# Patient Record
Sex: Female | Born: 2000 | Hispanic: No | Marital: Single | Smoking: Never smoker
Health system: Southern US, Community
[De-identification: ages and names within clinical notes are randomized; demographics above are authoritative.]

## PROBLEM LIST (undated history)

## (undated) DIAGNOSIS — G43909 Migraine, unspecified, not intractable, without status migrainosus: Secondary | ICD-10-CM

## (undated) DIAGNOSIS — R42 Dizziness and giddiness: Secondary | ICD-10-CM

## (undated) HISTORY — PX: NO PAST SURGERIES: SHX2092

---
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
## 2019-05-04 ENCOUNTER — Other Ambulatory Visit: Payer: Self-pay

## 2019-05-04 DIAGNOSIS — Z20822 Contact with and (suspected) exposure to covid-19: Secondary | ICD-10-CM

## 2019-05-06 LAB — NOVEL CORONAVIRUS, NAA: SARS-CoV-2, NAA: NOT DETECTED

## 2019-08-02 ENCOUNTER — Other Ambulatory Visit: Payer: Self-pay

## 2019-08-02 DIAGNOSIS — Z20822 Contact with and (suspected) exposure to covid-19: Secondary | ICD-10-CM

## 2019-08-04 LAB — NOVEL CORONAVIRUS, NAA: SARS-CoV-2, NAA: NOT DETECTED

## 2019-11-22 ENCOUNTER — Encounter: Payer: Self-pay | Admitting: Physical Therapy

## 2019-11-22 ENCOUNTER — Ambulatory Visit: Payer: Medicaid Other | Attending: Orthopedic Surgery | Admitting: Physical Therapy

## 2019-11-22 ENCOUNTER — Other Ambulatory Visit: Payer: Self-pay

## 2019-11-22 DIAGNOSIS — M6281 Muscle weakness (generalized): Secondary | ICD-10-CM

## 2019-11-22 DIAGNOSIS — G8929 Other chronic pain: Secondary | ICD-10-CM | POA: Diagnosis present

## 2019-11-22 DIAGNOSIS — S83012D Lateral subluxation of left patella, subsequent encounter: Secondary | ICD-10-CM | POA: Insufficient documentation

## 2019-11-22 DIAGNOSIS — M25562 Pain in left knee: Secondary | ICD-10-CM | POA: Insufficient documentation

## 2019-11-22 NOTE — Patient Instructions (Signed)
Access Code: [email or web address]: [email or web address]: 04/01/2021Prepared by: Casimiro Needle SherkExercises  Straight Leg Raise with External Rotation - 1 x daily - 7 x weekly - 1 sets - 20 reps  Supine Quad Set - 1 x daily - 7 x weekly - 1 sets - 10 reps - 10 seconds hold  Bridge - 1 x daily - 7 x weekly - 1 sets - 20 reps  Standard Lunge - 1 x daily - 7 x weekly - 1 sets - 10 reps - 5 seconds hold  Side Stepping with Resistance at Thighs - 1 x daily - 7 x weekly - 1 sets - 20 reps

## 2019-11-22 NOTE — Therapy (Signed)
[facility] [address] [place], [place], [address] Phone: [phone]   Fax:  [phone]  Physical Therapy Evaluation  Patient Details  Name: Cassandra Moore MRN: [id number] Date of Birth: 08-05-2001 Referring Provider (PT): Reche Dixon, [place]   Encounter Date: 11/22/2019  PT End of Session - 11/22/19 1219    Visit Number  1    Number of Visits  5    Date for PT Re-Evaluation  12/20/19    PT Start Time  1027    PT Stop Time  1120    PT Time Calculation (min)  53 min    Activity Tolerance  Patient tolerated treatment well;Patient limited by pain    Behavior During Therapy  [facility] for tasks assessed/performed       Past Medical History:  Diagnosis Date  . Migraine   . Migraines   . Vertigo   . Vertigo     Past Surgical History:  Procedure Laterality Date  . NO PAST SURGERIES      There were no vitals filed for this visit.   Subjective Assessment - 11/22/19 1136    Subjective  Pt. reports chronic L knee pain.  Pt. has had PT in past with benefit.  Pt. c/o 5/10 L knee pain currently at rest.  Pt. has supportive knee brace that she will wear with increase activity.    Limitations  Standing;Walking;Other (comment);House hold activities    Patient Stated Goals  Increase L knee stability/ pain-free mobility.    Currently in Pain?  Yes    Pain Score  5     Pain Location  Knee    Pain Orientation  Left    Pain Descriptors / Indicators  Aching    Pain Type  Chronic pain         [facility] PT Assessment - 11/22/19 0001      Assessment   Medical Diagnosis  Lateral subluxation of left patella/ Chronic pain of left knee    Referring Provider (PT)  Reche Dixon, PA-C    Onset Date/Surgical Date  08/24/15    Hand Dominance  Right    Prior Therapy  Yes, with benefit      Prior Function   Level of Independence  Independent      Cognition   Overall Cognitive Status  Within Functional Limits for tasks assessed       See  flowsheet    Objective measurements completed on examination: See above findings.     See HEP   PT Education - 11/22/19 1219    Education Details  See HEP/ discussed knee brace and taping.    Person(s) Educated  Patient    Methods  Explanation;Demonstration;Handout    Comprehension  Verbalized understanding;Returned demonstration          PT Long Term Goals - 11/22/19 1234      PT LONG TERM GOAL #1   Title  Pt. will increase FOTO to 68 to improve pain-free mobility.    Baseline  Initial FOTO: 59    Time  4    Period  Weeks    Status  New    Target Date  12/20/19      PT LONG TERM GOAL #2   Title  Pt. able to complete 30 minute LE ex. program with no increase c/o L knee pain.    Baseline  L knee pain 5/10 and pt. hoping to return to gym based ex.    Time  4    Period  Weeks    Status  New    Target Date  12/20/19      PT LONG TERM GOAL #3   Title  Pt. able to complete all work-related tasks with proper body mechanics/ no increase c/o L knee pain.    Baseline  Pt. currently out of work at [employer] due to L knee pain.    Time  4    Period  Weeks    Status  New    Target Date  12/20/19      PT LONG TERM GOAL #4   Title  Pt. will report no episodes of L knee subluxation over past 4 weeks to improve knee stability.    Baseline  Pt. reports L lateral knee subluxation while at work/ pivoting on L LE.    Time  4    Period  Weeks    Status  New    Target Date  12/20/19             Plan - 11/22/19 1220    Clinical Impression Statement  Pt. is a pleasant 19 y/o female with chronic c/o L knee pain.  Pt. reports 5/10 L knee pain at rest, 0/10 at best.  Pt. works at [employer] and is currently off work due to L knee pain/limitations.  B knee AROM WNL (slight hyperextension).  B LE muscle strength 5/5 MMT except hip flexion/ abduction 4+/5 MMT.  Pt. has good patellar tracking with squats/lunges.  Significant L patella hypermobility (lateral > medial).  Slight anterior  translation of B proximal tibia.  (-) anterior drawer/ varus/ valgus tests.  No knee joint line swelling noted.  FOTO: initial 59/ goal 68.  Pt. ambulates with normalized gait pattern with slight B foot IV noted during stance phase of gait.  Pt. planning to return to [employer] for cardio/ strengthening ex. program.  Pt. will benefit from skilled PT services to increase hip/knee stability to improve pain-free mobility with home/ work-related tasks.    Stability/Clinical Decision Making  Stable/Uncomplicated    Clinical Decision Making  Low    Rehab Potential  Good    PT Frequency  1x / week    PT Duration  4 weeks    PT Treatment/Interventions  ADLs/Self Care Home Management;Moist Heat;Electrical Stimulation;Cryotherapy;Functional mobility training;Therapeutic activities;Stair training;Gait training;Therapeutic exercise;Balance training;Neuromuscular re-education;Patient/family education;Manual techniques;Dry needling    PT Next Visit Plan  Progress LE stability ex. program.  Discuss return to [employer].    PT Home Exercise Plan  [id number]       Patient will benefit from skilled therapeutic intervention in order to improve the following deficits and impairments:  Abnormal gait, Decreased mobility, Obesity, Decreased range of motion, Decreased activity tolerance, Decreased strength, Hypermobility, Pain  Visit Diagnosis: Chronic pain of left knee  Chronic patellofemoral pain of left knee  Lateral subluxation of left patella, subsequent encounter  Muscle weakness (generalized)     Problem List There are no problems to display for this patient.  Cammie Mcgee, PT, DPT # [phone] 11/22/2019, 12:39 PM  [facility] [address] [place], [place], [address] Phone: [phone]   Fax:  [phone]  Name: Marcille Barman MRN: [id number] Date of Birth: 10-07-2000

## 2019-11-29 ENCOUNTER — Ambulatory Visit: Payer: Medicaid Other | Admitting: Physical Therapy

## 2019-11-29 ENCOUNTER — Other Ambulatory Visit: Payer: Self-pay

## 2019-11-29 ENCOUNTER — Encounter: Payer: Self-pay | Admitting: Physical Therapy

## 2019-11-29 DIAGNOSIS — M6281 Muscle weakness (generalized): Secondary | ICD-10-CM

## 2019-11-29 DIAGNOSIS — S83012D Lateral subluxation of left patella, subsequent encounter: Secondary | ICD-10-CM

## 2019-11-29 DIAGNOSIS — M25562 Pain in left knee: Secondary | ICD-10-CM

## 2019-11-29 DIAGNOSIS — G8929 Other chronic pain: Secondary | ICD-10-CM

## 2019-12-01 NOTE — Therapy (Signed)
[facility] [address] [place], [place], [address] Phone: [phone]   Fax:  [phone]  Physical Therapy Treatment  Patient Details  Name: Cassandra Moore MRN: [id number] Date of Birth: 2001/04/24 Referring Provider (PT): Dedra Skeens, [place]   Encounter Date: 11/29/2019  PT End of Session - 12/01/19 1449    Visit Number  2    Number of Visits  5    Date for PT Re-Evaluation  12/20/19    PT Start Time  1050    PT Stop Time  1147    PT Time Calculation (min)  57 min    Activity Tolerance  Patient tolerated treatment well;Patient limited by pain    Behavior During Therapy  [facility] for tasks assessed/performed       Past Medical History:  Diagnosis Date  . Migraine   . Migraines   . Vertigo   . Vertigo     Past Surgical History:  Procedure Laterality Date  . NO PAST SURGERIES      There were no vitals filed for this visit.  Subjective Assessment - 12/01/19 1448    Subjective  Pt. entered PT with no new complaints.  Pt. did not report any knee pain prior to tx. session.  Pt. states she is planning to go with mom to [employer].  Pt. has lost 7# of wt. over past week.    Limitations  Standing;Walking;Other (comment);House hold activities    Patient Stated Goals  Increase L knee stability/ pain-free mobility.    Currently in Pain?  No/denies        There.ex.:  TG knee flexion (midline/ toe in/ toe off)- 20x each.  Heel raises/ gastroc stretches.   Nustep L5 10 min. B LE (pt. Instructed pt. On taping technique) 6" step ups/ down 10x2 (no UE) BOSU step ups/downs (no UE)- 10x at edge of //-bars Partial lunges with static holds (knee in midline) 10x.  Lateral BOSU step ups 10x.  Reviewed HEP  Walking in hallway with gait reassessment while tape in place.  No c/o knee pain.      PT Long Term Goals - 11/22/19 1234      PT LONG TERM GOAL #1   Title  Pt. will increase FOTO to 68 to improve pain-free mobility.     Baseline  Initial FOTO: 59    Time  4    Period  Weeks    Status  New    Target Date  12/20/19      PT LONG TERM GOAL #2   Title  Pt. able to complete 30 minute LE ex. program with no increase c/o L knee pain.    Baseline  L knee pain 5/10 and pt. hoping to return to gym based ex.    Time  4    Period  Weeks    Status  New    Target Date  12/20/19      PT LONG TERM GOAL #3   Title  Pt. able to complete all work-related tasks with proper body mechanics/ no increase c/o L knee pain.    Baseline  Pt. currently out of work at [employer] due to L knee pain.    Time  4    Period  Weeks    Status  New    Target Date  12/20/19      PT LONG TERM GOAL #4   Title  Pt. will report no episodes of L knee subluxation  over past 4 weeks to improve knee stability.    Baseline  Pt. reports L lateral knee subluxation while at work/ pivoting on L LE.    Time  4    Period  Weeks    Status  New    Target Date  12/20/19            Plan - 12/01/19 1450    Clinical Impression Statement  Pt. states she feels better after taping technique to B patella (lateral to medial pull).  PT instructed pt. on self-taping and gave pt. tape for home/ gym use.  Pt. did really well with quad strengthening ex. program during tx. session with no increase c/o pain.  PT reviewed ex. program at [employer] to benefit pt. with LE strengthening/ cardio.    Stability/Clinical Decision Making  Stable/Uncomplicated    Clinical Decision Making  Low    Rehab Potential  Good    PT Frequency  1x / week    PT Duration  4 weeks    PT Treatment/Interventions  ADLs/Self Care Home Management;Moist Heat;Electrical Stimulation;Cryotherapy;Functional mobility training;Therapeutic activities;Stair training;Gait training;Therapeutic exercise;Balance training;Neuromuscular re-education;Patient/family education;Manual techniques;Dry needling    PT Next Visit Plan  Progress LE stability ex. program.  Discuss return to [employer].     PT Home Exercise Plan  [id number]       Patient will benefit from skilled therapeutic intervention in order to improve the following deficits and impairments:  Abnormal gait, Decreased mobility, Obesity, Decreased range of motion, Decreased activity tolerance, Decreased strength, Hypermobility, Pain  Visit Diagnosis: Chronic pain of left knee  Chronic patellofemoral pain of left knee  Lateral subluxation of left patella, subsequent encounter  Muscle weakness (generalized)     Problem List There are no problems to display for this patient.  Pura Spice, PT, DPT # [phone] 12/01/2019, 2:52 PM  [facility] [address] [place], [place], [address] Phone: [phone]   Fax:  [phone]  Name: Cassandra Moore MRN: [id number] Date of Birth: 2000-10-18

## 2019-12-06 ENCOUNTER — Ambulatory Visit: Payer: Medicaid Other | Admitting: Physical Therapy

## 2019-12-06 ENCOUNTER — Other Ambulatory Visit: Payer: Self-pay

## 2019-12-06 DIAGNOSIS — M25562 Pain in left knee: Secondary | ICD-10-CM

## 2019-12-06 DIAGNOSIS — S83012D Lateral subluxation of left patella, subsequent encounter: Secondary | ICD-10-CM

## 2019-12-06 DIAGNOSIS — G8929 Other chronic pain: Secondary | ICD-10-CM

## 2019-12-06 DIAGNOSIS — M6281 Muscle weakness (generalized): Secondary | ICD-10-CM

## 2019-12-06 NOTE — Therapy (Signed)
[facility] [address] [place], [place], [address] Phone: [phone]   Fax:  [phone]  Physical Therapy Treatment  Patient Details  Name: Cassandra Moore MRN: [id number] Date of Birth: 2000/09/20 Referring Provider (PT): Reche Dixon, [place]   Encounter Date: 12/06/2019  PT End of Session - 12/09/19 7035    Visit Number  3    Number of Visits  5    Date for PT Re-Evaluation  12/20/19    PT Start Time  0093    PT Stop Time  1645    PT Time Calculation (min)  49 min    Activity Tolerance  Patient tolerated treatment well    Behavior During Therapy  [facility] for tasks assessed/performed       Past Medical History:  Diagnosis Date  . Migraine   . Migraines   . Vertigo   . Vertigo     Past Surgical History:  Procedure Laterality Date  . NO PAST SURGERIES      There were no vitals filed for this visit.  Subjective Assessment - 12/09/19 1526    Subjective  Pt. entered PT with positive attitude and reports no knee pain at this time.  Pt. did not attend [place] last week and is hoping to start this week.  PT reviewed exercise/ machines that would be good for LE ex.    Limitations  Standing;Walking;Other (comment);House hold activities    Patient Stated Goals  Increase L knee stability/ pain-free mobility.    Currently in Pain?  No/denies        There.ex.:  Scifit L7 B LE only 10 min. (consistent cadence).    TG knee flexion (midline/ toe in/ toe off)- 20x each. Single leg squats 10x2.  Heel raises/ gastroc stretches.    Nautilus walk outs (60#) 5x all 4-planes (no UE assist)- SBA for safety.   Rebounder: NBOS/ tandem/ SLS, added Airex (tandem/ SLS)- 10x each (small blue ball).  BOSU step ups/downs (no UE)- 10x at edge of //-bars  Partial lunges with static holds (knee in midline) 10x.  Lateral BOSU step ups 10x.  Reviewed HEP  Discuss [employer] ex. Program.      PT Long Term Goals - 11/22/19 1234       PT LONG TERM GOAL #1   Title  Pt. will increase FOTO to 68 to improve pain-free mobility.    Baseline  Initial FOTO: 59    Time  4    Period  Weeks    Status  New    Target Date  12/20/19      PT LONG TERM GOAL #2   Title  Pt. able to complete 30 minute LE ex. program with no increase c/o L knee pain.    Baseline  L knee pain 5/10 and pt. hoping to return to gym based ex.    Time  4    Period  Weeks    Status  New    Target Date  12/20/19      PT LONG TERM GOAL #3   Title  Pt. able to complete all work-related tasks with proper body mechanics/ no increase c/o L knee pain.    Baseline  Pt. currently out of work at [employer] due to L knee pain.    Time  4    Period  Weeks    Status  New    Target Date  12/20/19      PT LONG TERM GOAL #  4   Title  Pt. will report no episodes of L knee subluxation over past 4 weeks to improve knee stability.    Baseline  Pt. reports L lateral knee subluxation while at work/ pivoting on L LE.    Time  4    Period  Weeks    Status  New    Target Date  12/20/19            Plan - 12/09/19 1533    Clinical Impression Statement  Pt. taped B knees prior to PT tx. session and states that it helps.  No c/o knee pain during tx. session.  Pt. did really well with squats/ higher level stability ex.  Pt. required practice to improve SLS stance at rebounder, esp. with use of Airex.  No change to HEP and encouraged to go to [employer].    Stability/Clinical Decision Making  Stable/Uncomplicated    Clinical Decision Making  Low    Rehab Potential  Good    PT Frequency  1x / week    PT Duration  4 weeks    PT Treatment/Interventions  ADLs/Self Care Home Management;Moist Heat;Electrical Stimulation;Cryotherapy;Functional mobility training;Therapeutic activities;Stair training;Gait training;Therapeutic exercise;Balance training;Neuromuscular re-education;Patient/family education;Manual techniques;Dry needling    PT Next Visit Plan  Progress LE  stability ex. program.  Discuss return to [employer].    PT Home Exercise Plan  [id number]       Patient will benefit from skilled therapeutic intervention in order to improve the following deficits and impairments:  Abnormal gait, Decreased mobility, Obesity, Decreased range of motion, Decreased activity tolerance, Decreased strength, Hypermobility, Pain  Visit Diagnosis: Chronic pain of left knee  Chronic patellofemoral pain of left knee  Lateral subluxation of left patella, subsequent encounter  Muscle weakness (generalized)     Problem List There are no problems to display for this patient.  Cammie Mcgee, PT, DPT # [phone] 12/09/2019, 3:40 PM  [facility] [address] [place], [place], [address] Phone: [phone]   Fax:  [phone]  Name: Gessica Jawad MRN: [id number] Date of Birth: June 14, 2001

## 2019-12-13 ENCOUNTER — Ambulatory Visit: Payer: Medicaid Other | Admitting: Physical Therapy

## 2019-12-21 ENCOUNTER — Encounter: Payer: Self-pay | Admitting: Physical Therapy

## 2019-12-21 ENCOUNTER — Ambulatory Visit: Payer: Medicaid Other | Admitting: Physical Therapy

## 2019-12-21 DIAGNOSIS — M6281 Muscle weakness (generalized): Secondary | ICD-10-CM

## 2019-12-21 DIAGNOSIS — S83012D Lateral subluxation of left patella, subsequent encounter: Secondary | ICD-10-CM

## 2019-12-21 DIAGNOSIS — M25562 Pain in left knee: Secondary | ICD-10-CM | POA: Diagnosis not present

## 2019-12-21 DIAGNOSIS — G8929 Other chronic pain: Secondary | ICD-10-CM

## 2019-12-21 NOTE — Therapy (Signed)
[facility] [address] [place], [place], [address] Phone: [phone]   Fax:  [phone]  Physical Therapy Treatment  Patient Details  Name: Cassandra Moore MRN: [id number] Date of Birth: Mar 19, 2001 Referring Provider (PT): Reche Dixon, [place]   Encounter Date: 12/21/2019  PT End of Session - 12/21/19 1544    Visit Number  4    Number of Visits  8    Date for PT Re-Evaluation  01/18/20    PT Start Time  1116    PT Stop Time  1204    PT Time Calculation (min)  48 min    Activity Tolerance  Patient tolerated treatment well    Behavior During Therapy  [facility] for tasks assessed/performed       Past Medical History:  Diagnosis Date  . Migraine   . Migraines   . Vertigo   . Vertigo     Past Surgical History:  Procedure Laterality Date  . NO PAST SURGERIES      There were no vitals filed for this visit.  Subjective Assessment - 12/21/19 1543    Subjective  Pt. reports missing last tx. session due to having cold.  Pt. has not returned to [employer] due to being sick and is hoping to return soon.  Pt. reports L knee pain after working 3 days in a row.    Limitations  Standing;Walking;Other (comment);House hold activities    Patient Stated Goals  Increase L knee stability/ pain-free mobility.    Currently in Pain?  Yes    Pain Score  3     Pain Location  Knee    Pain Orientation  Left        There.ex.:  Scifit L7 B LE only 10 min. (consistent cadence).    TG knee flexion (midline/ toe in/ toe off)- 20x each. Single leg squats 10x2. Heel raises/ gastroc stretches.   Nautilus walk outs (60#) 5x all 4-planes (no UE assist)- SBA for safety.   Rebounder: BOSU step ups with ball toss.   Discussed HEP/  Supine SAQ (max. Manual resistance 10x each).  Good patellar tracking.       PT Long Term Goals - 12/21/19 1547      PT LONG TERM GOAL #1   Title  Pt. will increase FOTO to 68 to improve pain-free mobility.     Baseline  Initial FOTO: 59.  4/30: 72    Time  4    Period  Weeks    Status  Achieved    Target Date  12/21/19      PT LONG TERM GOAL #2   Title  Pt. able to complete 30 minute LE ex. program with no increase c/o L knee pain.    Baseline  L knee pain 5/10 and pt. hoping to return to gym based ex.  4/30: minimal L knee pain reported.    Time  4    Period  Weeks    Status  Partially Met    Target Date  01/18/20      PT LONG TERM GOAL #3   Title  Pt. able to complete all work-related tasks with proper body mechanics/ no increase c/o L knee pain.    Baseline  Pt. currently out of work at [employer] due to L knee pain.  4/30: pt. working 3 days/week.  Increase pain with prolonged walking/ standing    Time  4    Period  Weeks    Status  Partially Met    Target Date  01/18/20      PT LONG TERM GOAL #4   Title  Pt. will report no episodes of L knee subluxation over past 4 weeks to improve knee stability.    Baseline  Pt. reports L lateral knee subluxation while at work/ pivoting on L LE.    Time  4    Period  Weeks    Status  Partially Met    Target Date  01/18/20            Plan - 12/21/19 1545    Clinical Impression Statement  Pt. has benefitted from use of k-tape to L knee during resisted ther.ex./ walking to prevent lateral tracking of patella.  Pt. progressing well with quad stability/ strengthening but limited recently by being sick/ not participating with [employer].  Good patella tracking with knee flexion on TG and during BOSU step ups.  Pt. works hard during PT tx. session.  See updated goals.    Stability/Clinical Decision Making  Stable/Uncomplicated    Clinical Decision Making  Low    Rehab Potential  Good    PT Frequency  1x / week    PT Duration  4 weeks    PT Treatment/Interventions  ADLs/Self Care Home Management;Moist Heat;Electrical Stimulation;Cryotherapy;Functional mobility training;Therapeutic activities;Stair training;Gait training;Therapeutic  exercise;Balance training;Neuromuscular re-education;Patient/family education;Manual techniques;Dry needling    PT Next Visit Plan  Progress LE stability ex. program.  Discuss return to [employer].    PT Home Exercise Plan  [id number]       Patient will benefit from skilled therapeutic intervention in order to improve the following deficits and impairments:  Abnormal gait, Decreased mobility, Obesity, Decreased range of motion, Decreased activity tolerance, Decreased strength, Hypermobility, Pain  Visit Diagnosis: Chronic pain of left knee  Chronic patellofemoral pain of left knee  Lateral subluxation of left patella, subsequent encounter  Muscle weakness (generalized)     Problem List There are no problems to display for this patient.  Pura Spice, PT, DPT # [phone] 12/21/2019, 3:57 PM  [facility] [address] [place], [place], [address] Phone: [phone]   Fax:  [phone]  Name: Aeriel Boulay MRN: [id number] Date of Birth: [birth year]

## 2019-12-28 ENCOUNTER — Other Ambulatory Visit: Payer: Self-pay

## 2019-12-28 ENCOUNTER — Ambulatory Visit: Payer: Medicaid Other | Attending: Orthopedic Surgery | Admitting: Physical Therapy

## 2019-12-28 ENCOUNTER — Encounter: Payer: Self-pay | Admitting: Physical Therapy

## 2019-12-28 DIAGNOSIS — M6281 Muscle weakness (generalized): Secondary | ICD-10-CM | POA: Insufficient documentation

## 2019-12-28 DIAGNOSIS — M25562 Pain in left knee: Secondary | ICD-10-CM | POA: Insufficient documentation

## 2019-12-28 DIAGNOSIS — S83012D Lateral subluxation of left patella, subsequent encounter: Secondary | ICD-10-CM | POA: Diagnosis present

## 2019-12-28 DIAGNOSIS — G8929 Other chronic pain: Secondary | ICD-10-CM | POA: Insufficient documentation

## 2019-12-28 NOTE — Therapy (Signed)
[facility] [address] [place], [place], [address] Phone: [phone]   Fax:  [phone]  Physical Therapy Treatment  Patient Details  Name: Cassandra Moore MRN: [id number] Date of Birth: 08-22-2001 Referring Provider (PT): Reche Dixon, [place]   Encounter Date: 12/28/2019  PT End of Session - 12/28/19 1745    Visit Number  5    Number of Visits  8    Date for PT Re-Evaluation  01/18/20    PT Start Time  1005    PT Stop Time  1102    PT Time Calculation (min)  57 min    Activity Tolerance  Patient tolerated treatment well    Behavior During Therapy  [facility] for tasks assessed/performed       Past Medical History:  Diagnosis Date  . Migraine   . Migraines   . Vertigo   . Vertigo     Past Surgical History:  Procedure Laterality Date  . NO PAST SURGERIES      There were no vitals filed for this visit.  Subjective Assessment - 12/28/19 1744    Subjective  Pt. states she work only 1 day this week due to depression.  No c/o knee pain at this time.  Pt. states she is hoping to return to regular working hours once PT is over.    Limitations  Standing;Walking;Other (comment);House hold activities    Patient Stated Goals  Increase L knee stability/ pain-free mobility.    Currently in Pain?  No/denies         Pt. Has not returned to [employer] at this time  There.ex.:  Walking lunges in //-bars with no UE assist 10x2 (good patella tracking) Hallway walking (increase cadence)- assessing heel strike/ knee position BOSU step ups L/R 10x each with no UE assist/ Reverse BOSU squats 20x with holds (good patella tracking/ increase holds and muscle fasciculations noted). Scifit L7 10 min. B LE.  Resisted gait (Nautilus)- 80# 5x each direction Jogging in hallway (no pain)  Discussed HEP and return to [place] - 12/21/19 1547      PT LONG TERM GOAL #1   Title  Pt. will increase FOTO to 68 to  improve pain-free mobility.    Baseline  Initial FOTO: 59.  4/30: 72    Time  4    Period  Weeks    Status  Achieved    Target Date  12/21/19      PT LONG TERM GOAL #2   Title  Pt. able to complete 30 minute LE ex. program with no increase c/o L knee pain.    Baseline  L knee pain 5/10 and pt. hoping to return to gym based ex.  4/30: minimal L knee pain reported.    Time  4    Period  Weeks    Status  Partially Met    Target Date  01/18/20      PT LONG TERM GOAL #3   Title  Pt. able to complete all work-related tasks with proper body mechanics/ no increase c/o L knee pain.    Baseline  Pt. currently out of work at [employer] due to L knee pain.  4/30: pt. working 3 days/week.  Increase pain with prolonged walking/ standing    Time  4    Period  Weeks    Status  Partially Met    Target Date  01/18/20  PT LONG TERM GOAL #4   Title  Pt. will report no episodes of L knee subluxation over past 4 weeks to improve knee stability.    Baseline  Pt. reports L lateral knee subluxation while at work/ pivoting on L LE.    Time  4    Period  Weeks    Status  Partially Met    Target Date  01/18/20            Plan - 12/28/19 1746    Clinical Impression Statement  Good technique/ mid-line knee position during walking lunges/ squats in //-bars.  Pt. presents with good B patella tracking with closed chain knee flexion.  No c/o knee pain during higher level BOSU squats/ jogging in PT clinic.  PT will progress HEP if no pain issues over next week.    Stability/Clinical Decision Making  Stable/Uncomplicated    Clinical Decision Making  Low    Rehab Potential  Good    PT Frequency  1x / week    PT Duration  4 weeks    PT Treatment/Interventions  ADLs/Self Care Home Management;Moist Heat;Electrical Stimulation;Cryotherapy;Functional mobility training;Therapeutic activities;Stair training;Gait training;Therapeutic exercise;Balance training;Neuromuscular re-education;Patient/family  education;Manual techniques;Dry needling    PT Next Visit Plan  Progress LE stability ex. program.  Discuss return to [employer].    PT Home Exercise Plan  [id number]       Patient will benefit from skilled therapeutic intervention in order to improve the following deficits and impairments:  Abnormal gait, Decreased mobility, Obesity, Decreased range of motion, Decreased activity tolerance, Decreased strength, Hypermobility, Pain  Visit Diagnosis: Chronic pain of left knee  Chronic patellofemoral pain of left knee  Lateral subluxation of left patella, subsequent encounter  Muscle weakness (generalized)     Problem List There are no problems to display for this patient.  Pura Spice, PT, DPT # [phone] 12/28/2019, 5:48 PM  [facility] [address] [place], [place], [address] Phone: [phone]   Fax:  [phone]  Name: Cassandra Moore MRN: [id number] Date of Birth: [birth year]

## 2020-01-04 ENCOUNTER — Ambulatory Visit: Payer: Medicaid Other | Admitting: Physical Therapy

## 2020-01-04 ENCOUNTER — Other Ambulatory Visit: Payer: Self-pay

## 2020-01-04 ENCOUNTER — Encounter: Payer: Self-pay | Admitting: Physical Therapy

## 2020-01-04 DIAGNOSIS — M25562 Pain in left knee: Secondary | ICD-10-CM

## 2020-01-04 DIAGNOSIS — S83012D Lateral subluxation of left patella, subsequent encounter: Secondary | ICD-10-CM

## 2020-01-04 DIAGNOSIS — M6281 Muscle weakness (generalized): Secondary | ICD-10-CM

## 2020-01-04 DIAGNOSIS — G8929 Other chronic pain: Secondary | ICD-10-CM

## 2020-01-04 NOTE — Therapy (Signed)
[facility] [address] [place], [place], [address] Phone: [phone]   Fax:  [phone]  Physical Therapy Treatment  Patient Details  Name: Cassandra Moore MRN: [id number] Date of Birth: [birth year] Referring Provider (PT): Reche Dixon, [place]   Encounter Date: 01/04/2020  PT End of Session - 01/04/20 1320    Visit Number  6    Number of Visits  8    Date for PT Re-Evaluation  01/18/20    PT Start Time  1001    PT Stop Time  1050    PT Time Calculation (min)  49 min    Activity Tolerance  Patient tolerated treatment well    Behavior During Therapy  [facility] for tasks assessed/performed       Past Medical History:  Diagnosis Date  . Migraine   . Migraines   . Vertigo   . Vertigo     Past Surgical History:  Procedure Laterality Date  . NO PAST SURGERIES      There were no vitals filed for this visit.  Subjective Assessment - 01/04/20 1309    Subjective  Pt. reports no knee pain today.  Pt. still hasn't joined [employer].  Pt. worked 2 days at [employer] this week.    Limitations  Standing;Walking;Other (comment);House hold activities    Patient Stated Goals  Increase L knee stability/ pain-free mobility.    Currently in Pain?  No/denies      There.ex.:  Walking in clinic (increase cadence) with gait assessment/ consistent heel strike. BOSU step ups (forward/ lateral)- 10x each with no UE assist Supine ball ex. (green): knee to chest/ bridging/ bridging with added knee to chest 10x each. TG knee flexion (midline/ toe in/ toe out) 20x each.  Heel raises/ gastroc stretches 10x Scifit L7 10 min. B UE/LE Jogging in hallway with no knee pain.    PT Long Term Goals - 12/21/19 1547      PT LONG TERM GOAL #1   Title  Pt. will increase FOTO to 68 to improve pain-free mobility.    Baseline  Initial FOTO: 59.  4/30: 72    Time  4    Period  Weeks    Status  Achieved    Target Date  12/21/19      PT LONG TERM GOAL #2   Title  Pt. able to complete 30 minute LE ex. program with no increase c/o L knee pain.    Baseline  L knee pain 5/10 and pt. hoping to return to gym based ex.  4/30: minimal L knee pain reported.    Time  4    Period  Weeks    Status  Partially Met    Target Date  01/18/20      PT LONG TERM GOAL #3   Title  Pt. able to complete all work-related tasks with proper body mechanics/ no increase c/o L knee pain.    Baseline  Pt. currently out of work at [employer] due to L knee pain.  4/30: pt. working 3 days/week.  Increase pain with prolonged walking/ standing    Time  4    Period  Weeks    Status  Partially Met    Target Date  01/18/20      PT LONG TERM GOAL #4   Title  Pt. will report no episodes of L knee subluxation over past 4 weeks to improve knee stability.    Baseline  Pt. reports L lateral  knee subluxation while at work/ pivoting on L LE.    Time  4    Period  Weeks    Status  Partially Met    Target Date  01/18/20            Plan - 01/04/20 1320    Clinical Impression Statement  Pt. progressing well with quad/ knee stability ex. program.  No c/o knee pain with Scifit/ squats and stability ex.  Good patella tracking with knee extension/ eccentric muscle control t/o tx. session.  Pt. encouraged to particpate with more gym based ex. program.    Stability/Clinical Decision Making  Stable/Uncomplicated    Clinical Decision Making  Low    Rehab Potential  Good    PT Frequency  1x / week    PT Duration  4 weeks    PT Treatment/Interventions  ADLs/Self Care Home Management;Moist Heat;Electrical Stimulation;Cryotherapy;Functional mobility training;Therapeutic activities;Stair training;Gait training;Therapeutic exercise;Balance training;Neuromuscular re-education;Patient/family education;Manual techniques;Dry needling    PT Next Visit Plan  Progress LE stability ex. program.  Discuss return to [employer].    PT Home Exercise Plan  [id number]       Patient will benefit from  skilled therapeutic intervention in order to improve the following deficits and impairments:  Abnormal gait, Decreased mobility, Obesity, Decreased range of motion, Decreased activity tolerance, Decreased strength, Hypermobility, Pain  Visit Diagnosis: Chronic pain of left knee  Chronic patellofemoral pain of left knee  Lateral subluxation of left patella, subsequent encounter  Muscle weakness (generalized)     Problem List There are no problems to display for this patient.  Pura Spice, PT, DPT # [phone] 01/04/2020, 1:27 PM  [place] [address] [place], [place], [address] Phone: [phone]   Fax:  [phone]  Name: Cailey Trigueros MRN: [id number] Date of Birth: Jun 19, 2001

## 2020-01-17 ENCOUNTER — Encounter: Payer: Self-pay | Admitting: Physical Therapy

## 2020-01-17 ENCOUNTER — Ambulatory Visit: Payer: Medicaid Other | Admitting: Physical Therapy

## 2020-01-17 ENCOUNTER — Other Ambulatory Visit: Payer: Self-pay

## 2020-01-17 DIAGNOSIS — M6281 Muscle weakness (generalized): Secondary | ICD-10-CM

## 2020-01-17 DIAGNOSIS — S83012D Lateral subluxation of left patella, subsequent encounter: Secondary | ICD-10-CM

## 2020-01-17 DIAGNOSIS — M25562 Pain in left knee: Secondary | ICD-10-CM | POA: Diagnosis not present

## 2020-01-17 DIAGNOSIS — G8929 Other chronic pain: Secondary | ICD-10-CM

## 2020-01-17 NOTE — Therapy (Signed)
[facility] [address] [place], [place], [address] Phone: [phone]   Fax:  [phone]  Physical Therapy Treatment  Patient Details  Name: Cassandra Moore MRN: [id number] Date of Birth: Apr 15, 2001 Referring Provider (PT): Dedra Skeens, [place]   Encounter Date: 01/17/2020  PT End of Session - 01/17/20 1355    Visit Number  7    Number of Visits  8    Date for PT Re-Evaluation  01/18/20    PT Start Time  1344    PT Stop Time  1433    PT Time Calculation (min)  49 min    Activity Tolerance  Patient tolerated treatment well    Behavior During Therapy  [facility] for tasks assessed/performed       Past Medical History:  Diagnosis Date  . Migraine   . Migraines   . Vertigo   . Vertigo     Past Surgical History:  Procedure Laterality Date  . NO PAST SURGERIES      There were no vitals filed for this visit.  Subjective Assessment - 01/17/20 1352    Subjective  Pt. doing well.  Pt. 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    Limitations  Standing;Walking;Other (comment);House hold activities    Patient Stated Goals  Increase L knee stability/ pain-free mobility.    Currently in Pain?  No/denies         There.ex.:  Scifit L7 12 min. B UE/LE (forward/ backwards)- L knee crepitus noted with tracking issue.    Walking in clinic (increase cadence) with gait assessment/ consistent heel strike.  Walking lunges/ high marching in hallway 2 laps each. 12" step ups 10x L/R and 8" Airex step ups 10x each. TG knee (Airex) flexion (midline/ toe in/ toe out) 20x each.  Heel raises/ gastroc stretches 10x BOSU step ups (forward)- 10x each with no UE assist.  SLS >10 sec. On BOSU with occasional light UE assist at handrail Prostretch 10x with static holds (at TM for UE holds).   Jogging in hallway with no knee pain.  Goal reassessment    PT Long Term Goals - 01/17/20 1505       PT LONG TERM GOAL #1   Title  Pt. will increase FOTO to 68 to improve pain-free mobility.    Baseline  Initial FOTO: 59.  4/30: 72    Time  4    Period  Weeks    Status  Achieved    Target Date  12/21/19      PT LONG TERM GOAL #2   Title  Pt. able to complete 30 minute LE ex. program with no increase c/o L knee pain.    Baseline  L knee pain 5/10 and pt. hoping to return to gym based ex.  4/30: minimal L knee pain reported.    Time  4    Period  Weeks    Status  Achieved    Target Date  01/17/20      PT LONG TERM GOAL #3   Title  Pt. able to complete all work-related tasks with proper body mechanics/ no increase c/o L knee pain.    Baseline  Pt. currently out of work at [employer] due to L knee pain.  4/30: pt. working 3 days/week.  Increase pain with prolonged walking/ standing    Time  4    Period  Weeks    Status  Achieved    Target Date  01/17/20      PT LONG TERM GOAL #4   Title  Pt. will report no episodes of L knee subluxation over past 4 weeks to improve knee stability.    Baseline  Pt. reports L lateral knee subluxation while at work/ pivoting on L LE.    Time  4    Period  Weeks    Status  Achieved    Target Date  01/17/20            Plan - 01/17/20 1355    Clinical Impression Statement  No c/o knee pain during high level quad stability ex.  Pt. has progressed well and will continue to benefit from a consistent ther.ex./ LE stability program on an independent basis at local gym.  Pt. independent with taping knees PRN and instructed to contact PT if any issues.  Probable discharge from PT at this time.    Stability/Clinical Decision Making  Stable/Uncomplicated    Clinical Decision Making  Low    Rehab Potential  Good    PT Frequency  1x / week    PT Duration  4 weeks    PT Treatment/Interventions  ADLs/Self Care Home Management;Moist Heat;Electrical Stimulation;Cryotherapy;Functional mobility training;Therapeutic activities;Stair training;Gait  training;Therapeutic exercise;Balance training;Neuromuscular re-education;Patient/family education;Manual techniques;Dry needling    PT Next Visit Plan  Discharge    PT [place]       Patient will benefit from skilled therapeutic intervention in order to improve the following deficits and impairments:  Abnormal gait, Decreased mobility, Obesity, Decreased range of motion, Decreased activity tolerance, Decreased strength, Hypermobility, Pain  Visit Diagnosis: Chronic pain of left knee  Chronic patellofemoral pain of left knee  Lateral subluxation of left patella, subsequent encounter  Muscle weakness (generalized)     Problem List There are no problems to display for this patient.  Pura Spice, PT, DPT # [phone] 01/17/2020, 3:06 PM  [facility] [address] [place], [place], [address] Phone: [phone]   Fax:  [phone]  Name: Cassandra Moore MRN: [id number] Date of Birth: 02/01/2001

## 2020-02-21 DIAGNOSIS — Z419 Encounter for procedure for purposes other than remedying health state, unspecified: Secondary | ICD-10-CM | POA: Diagnosis not present

## 2020-03-23 DIAGNOSIS — Z419 Encounter for procedure for purposes other than remedying health state, unspecified: Secondary | ICD-10-CM | POA: Diagnosis not present

## 2020-04-23 DIAGNOSIS — Z419 Encounter for procedure for purposes other than remedying health state, unspecified: Secondary | ICD-10-CM | POA: Diagnosis not present

## 2020-05-20 ENCOUNTER — Encounter (HOSPITAL_COMMUNITY): Payer: Self-pay

## 2020-05-20 ENCOUNTER — Ambulatory Visit (HOSPITAL_COMMUNITY)
Admission: EM | Admit: 2020-05-20 | Discharge: 2020-05-20 | Disposition: A | Discharge status: Home / Self Care | Payer: Medicaid Other | Attending: Urgent Care | Admitting: Urgent Care

## 2020-05-20 ENCOUNTER — Other Ambulatory Visit: Payer: Self-pay

## 2020-05-20 DIAGNOSIS — M62830 Muscle spasm of back: Secondary | ICD-10-CM | POA: Insufficient documentation

## 2020-05-20 DIAGNOSIS — Z3202 Encounter for pregnancy test, result negative: Secondary | ICD-10-CM | POA: Diagnosis not present

## 2020-05-20 DIAGNOSIS — M545 Low back pain, unspecified: Secondary | ICD-10-CM

## 2020-05-20 LAB — POC URINE PREG, ED: Preg Test, Ur: NEGATIVE

## 2020-05-20 LAB — POCT URINALYSIS DIPSTICK, ED / UC
Bilirubin Urine: NEGATIVE
Glucose, UA: NEGATIVE mg/dL
Hgb urine dipstick: NEGATIVE
Ketones, ur: NEGATIVE mg/dL
Leukocytes,Ua: NEGATIVE
Nitrite: NEGATIVE
Protein, ur: NEGATIVE mg/dL
Specific Gravity, Urine: >=1.03 (ref 1.005–1.030)
Urobilinogen, UA: 0.2 mg/dL (ref 0.0–1.0)
pH: 5 (ref 5.0–8.0)

## 2020-05-20 MED ORDER — TIZANIDINE HCL 4 MG PO TABS: 4.0000 mg | ORAL_TABLET | Freq: Three times a day (TID) | ORAL | 0 refills | Status: DC | PRN

## 2020-05-20 MED ORDER — NAPROXEN 500 MG PO TABS: 500.0000 mg | ORAL_TABLET | Freq: Two times a day (BID) | ORAL | 0 refills | Status: DC

## 2020-05-20 NOTE — ED Provider Notes (Signed)
[facility]   MRN: [id number] DOB: Jun 29, 2001  Subjective:   Cassandra Moore is a 19 y.o. female presenting for 2-week history of persistent left lower back pain.  Has also had urinary frequency.  Patient states that symptoms initially started in the mid back but have progressively worsened to the left side.  Denies fever, nausea, vomiting, dysuria, hematuria.  Patient states that she hydrates mostly with water, has about 3-4 bottles of water per day.  Denies any history of back injuries, traumas.  She works at [employer], does not do a lot of heavy lifting there either.  No current facility-administered medications for this encounter.  Current Outpatient Medications:    naproxen (NAPROSYN) 500 MG tablet, Take 1 tablet (500 mg total) by mouth 2 (two) times daily with a meal., Disp: 60 tablet, Rfl: 0   sertraline (ZOLOFT) 50 MG tablet, Take 75 mg by mouth daily., Disp: , Rfl:    TRAZODONE HCL ER PO, Take 25 mg by mouth daily., Disp: , Rfl:    No Known Allergies  Past Medical History:  Diagnosis Date   Migraine    Migraines    Vertigo    Vertigo      Past Surgical History:  Procedure Laterality Date   NO PAST SURGERIES      Family History  Problem Relation Age of Onset   Depression Mother     Social History   Tobacco Use   Smoking status: Never Smoker   Smokeless tobacco: Never Used  Vaping Use   Vaping Use: Never used  Substance Use Topics   Alcohol use: No   Drug use: No    ROS   Objective:   Vitals: BP 109/63    Pulse 67    Temp 97.8 F (36.6 C)    Resp 19    LMP 04/23/2020    SpO2 97%   Physical Exam Constitutional:      General: She is not in acute distress.    Appearance: Normal appearance. She is well-developed. She is not ill-appearing.  HENT:     Head: Normocephalic and atraumatic.     Nose: Nose normal.     Mouth/Throat:     Mouth: Mucous membranes are moist.     Pharynx: Oropharynx is clear.  Eyes:      General: No scleral icterus.       Right eye: No discharge.        Left eye: No discharge.     Extraocular Movements: Extraocular movements intact.     Conjunctiva/sclera: Conjunctivae normal.     Pupils: Pupils are equal, round, and reactive to light.  Cardiovascular:     Rate and Rhythm: Normal rate.  Pulmonary:     Effort: Pulmonary effort is normal.  Abdominal:     Tenderness: There is no right CVA tenderness or left CVA tenderness.  Musculoskeletal:     Lumbar back: Spasms and tenderness present. No swelling, edema, deformity, signs of trauma, lacerations or bony tenderness. Normal range of motion. Negative right straight leg raise test and negative left straight leg raise test. No scoliosis.       Back:  Skin:    General: Skin is warm and dry.  Neurological:     General: No focal deficit present.     Mental Status: She is alert and oriented to person, place, and time.     Motor: No weakness.     Coordination: Coordination normal.     Gait:  Gait normal.     Deep Tendon Reflexes: Reflexes normal.  Psychiatric:        Mood and Affect: Mood normal.        Behavior: Behavior normal.        Thought Content: Thought content normal.        Judgment: Judgment normal.     Results for orders placed or performed during the hospital encounter of 05/20/20 (from the past 24 hour(s))  POC Urinalysis dipstick     Status: None   Collection Time: 05/20/20  5:19 PM  Result Value Ref Range   Glucose, UA NEGATIVE NEGATIVE mg/dL   Bilirubin Urine NEGATIVE NEGATIVE   Ketones, ur NEGATIVE NEGATIVE mg/dL   Specific Gravity, Urine >=1.030 1.005 - 1.030   Hgb urine dipstick NEGATIVE NEGATIVE   pH 5.0 5.0 - 8.0   Protein, ur NEGATIVE NEGATIVE mg/dL   Urobilinogen, UA 0.2 0.0 - 1.0 mg/dL   Nitrite NEGATIVE NEGATIVE   Leukocytes,Ua NEGATIVE NEGATIVE  POC urine pregnancy     Status: None   Collection Time: 05/20/20  5:24 PM  Result Value Ref Range   Preg Test, Ur NEGATIVE NEGATIVE     Assessment and Plan :   PDMP not reviewed this encounter.  1. Acute left-sided low back pain without sciatica   2. Muscle spasm of back     We will manage for musculoskeletal type pain with NSAID, muscle relaxant.  Counseled on back care.  Urine culture pending. Counseled patient on potential for adverse effects with medications prescribed/recommended today, ER and return-to-clinic precautions discussed, patient verbalized understanding.    Wallis Bamberg, PA-C 05/20/20 1730

## 2020-05-20 NOTE — ED Triage Notes (Signed)
Pt presents with complaints of left lower back pain x 2 weeks. Denies any injury. Reports it started in her mid back and has moved to her left lower side. Pt denies any dysuria or abdominal pain. Reports some relief with naproxen.

## 2020-05-21 LAB — URINE CULTURE

## 2020-05-23 DIAGNOSIS — Z419 Encounter for procedure for purposes other than remedying health state, unspecified: Secondary | ICD-10-CM | POA: Diagnosis not present

## 2020-06-23 DIAGNOSIS — Z419 Encounter for procedure for purposes other than remedying health state, unspecified: Secondary | ICD-10-CM | POA: Diagnosis not present

## 2020-07-23 DIAGNOSIS — Z419 Encounter for procedure for purposes other than remedying health state, unspecified: Secondary | ICD-10-CM | POA: Diagnosis not present

## 2020-08-23 DIAGNOSIS — Z419 Encounter for procedure for purposes other than remedying health state, unspecified: Secondary | ICD-10-CM | POA: Diagnosis not present

## 2020-09-10 ENCOUNTER — Other Ambulatory Visit: Payer: Self-pay

## 2020-09-10 ENCOUNTER — Encounter (HOSPITAL_COMMUNITY): Payer: Self-pay

## 2020-09-10 ENCOUNTER — Ambulatory Visit (HOSPITAL_COMMUNITY)
Admission: EM | Admit: 2020-09-10 | Discharge: 2020-09-10 | Disposition: A | Discharge status: Home / Self Care | Payer: Medicaid Other | Attending: Family Medicine | Admitting: Family Medicine

## 2020-09-10 DIAGNOSIS — U071 COVID-19: Secondary | ICD-10-CM | POA: Insufficient documentation

## 2020-09-10 DIAGNOSIS — Z20822 Contact with and (suspected) exposure to covid-19: Secondary | ICD-10-CM | POA: Diagnosis present

## 2020-09-10 DIAGNOSIS — J069 Acute upper respiratory infection, unspecified: Secondary | ICD-10-CM | POA: Diagnosis not present

## 2020-09-10 LAB — SARS CORONAVIRUS 2 (TAT 6-24 HRS): SARS Coronavirus 2: POSITIVE — AB

## 2020-09-10 NOTE — ED Provider Notes (Signed)
  [facility]   [id number] 09/10/20 Arrival Time: 1222  ASSESSMENT & PLAN:  1. Viral URI with cough   2. Close exposure to COVID-19 virus     COVID-19 testing sent. See letter/work note on file for self-isolation guidelines. OTC symptom care as needed.     Follow-up Information    [facility] at [facility].   Specialty: Urgent Care Why: As needed. Contact information: [address] [place] [address] [phone]              Reviewed expectations re: course of current medical issues. Questions answered. Outlined signs and symptoms indicating need for more acute intervention. Understanding verbalized. After Visit Summary given.   SUBJECTIVE: History from: patient. Cassandra Moore is a 20 y.o. female who presents with worries regarding COVID-19. Known COVID-19 contact: brother. Recent travel: none. Reports: nasal congestion, nausea, HA x 3-4 days. Denies: fever and difficulty breathing. Normal PO intake without n/v/d.    OBJECTIVE:  Vitals:   09/10/20 1414  BP: 112/60  Pulse: 82  Resp: 18  Temp: 98.3 F (36.8 C)  TempSrc: Oral  SpO2: 100%    General appearance: alert; no distress Eyes: PERRLA; EOMI; conjunctiva normal HENT: Reform; AT; with nasal congestion Neck: supple  Lungs: speaks full sentences without difficulty; unlabored Extremities: no edema Skin: warm and dry Neurologic: normal gait Psychological: alert and cooperative; normal mood and affect  Labs:  Labs Reviewed  SARS CORONAVIRUS 2 (TAT 6-24 HRS)     No Known Allergies  Past Medical History:  Diagnosis Date  . Migraine   . Migraines   . Vertigo   . Vertigo    Social History   Socioeconomic History  . Marital status: Single    Spouse name: Not on file  . Number of children: Not on file  . Years of education: Not on file  . Highest education level: Not on file  Occupational History  . Not on file  Tobacco Use  . Smoking status: Never  Smoker  . Smokeless tobacco: Never Used  Vaping Use  . Vaping Use: Never used  Substance and Sexual Activity  . Alcohol use: No  . Drug use: No  . Sexual activity: Never  Other Topics Concern  . Not on file  Social History Narrative  . Not on file   Social Determinants of Health   Financial Resource Strain: Not on file  Food Insecurity: Not on file  Transportation Needs: Not on file  Physical Activity: Not on file  Stress: Not on file  Social Connections: Not on file  Intimate Partner Violence: Not on file   Family History  Problem Relation Age of Onset  . Depression Mother    Past Surgical History:  Procedure Laterality Date  . NO PAST SURGERIES       Mardella Layman, MD 09/10/20 1459

## 2020-09-10 NOTE — Discharge Instructions (Signed)
You have been tested for COVID-19 today. °If your test returns positive, you will receive a phone call from [facility] regarding your results. °Negative test results are not called. °Both positive and negative results area always visible on MyChart. °If you do not have a MyChart account, sign up instructions are provided in your discharge papers. °Please do not hesitate to contact us should you have questions or concerns. ° °

## 2020-09-10 NOTE — ED Triage Notes (Signed)
Pt c/o headaches, nausea, nasal congestion, coughing and lightheadedness X 4 days. Pt states she has taken ibuprofen last night for her headache. Pt states she has been around her COVID positive sibling.

## 2020-09-23 DIAGNOSIS — Z419 Encounter for procedure for purposes other than remedying health state, unspecified: Secondary | ICD-10-CM | POA: Diagnosis not present

## 2020-10-21 DIAGNOSIS — Z419 Encounter for procedure for purposes other than remedying health state, unspecified: Secondary | ICD-10-CM | POA: Diagnosis not present

## 2020-11-21 DIAGNOSIS — Z419 Encounter for procedure for purposes other than remedying health state, unspecified: Secondary | ICD-10-CM | POA: Diagnosis not present

## 2020-12-21 DIAGNOSIS — Z419 Encounter for procedure for purposes other than remedying health state, unspecified: Secondary | ICD-10-CM | POA: Diagnosis not present

## 2021-01-21 DIAGNOSIS — Z419 Encounter for procedure for purposes other than remedying health state, unspecified: Secondary | ICD-10-CM | POA: Diagnosis not present

## 2021-02-20 DIAGNOSIS — Z419 Encounter for procedure for purposes other than remedying health state, unspecified: Secondary | ICD-10-CM | POA: Diagnosis not present

## 2021-03-06 DIAGNOSIS — F324 Major depressive disorder, single episode, in partial remission: Secondary | ICD-10-CM | POA: Diagnosis not present

## 2021-03-23 DIAGNOSIS — Z419 Encounter for procedure for purposes other than remedying health state, unspecified: Secondary | ICD-10-CM | POA: Diagnosis not present

## 2021-04-23 DIAGNOSIS — Z419 Encounter for procedure for purposes other than remedying health state, unspecified: Secondary | ICD-10-CM | POA: Diagnosis not present

## 2021-05-23 DIAGNOSIS — Z419 Encounter for procedure for purposes other than remedying health state, unspecified: Secondary | ICD-10-CM | POA: Diagnosis not present

## 2021-06-23 DIAGNOSIS — Z419 Encounter for procedure for purposes other than remedying health state, unspecified: Secondary | ICD-10-CM | POA: Diagnosis not present

## 2021-07-23 DIAGNOSIS — Z419 Encounter for procedure for purposes other than remedying health state, unspecified: Secondary | ICD-10-CM | POA: Diagnosis not present

## 2021-08-23 DIAGNOSIS — Z419 Encounter for procedure for purposes other than remedying health state, unspecified: Secondary | ICD-10-CM | POA: Diagnosis not present

## 2021-09-23 DIAGNOSIS — Z419 Encounter for procedure for purposes other than remedying health state, unspecified: Secondary | ICD-10-CM | POA: Diagnosis not present

## 2021-10-21 DIAGNOSIS — Z419 Encounter for procedure for purposes other than remedying health state, unspecified: Secondary | ICD-10-CM | POA: Diagnosis not present

## 2021-11-21 DIAGNOSIS — Z419 Encounter for procedure for purposes other than remedying health state, unspecified: Secondary | ICD-10-CM | POA: Diagnosis not present

## 2021-12-21 DIAGNOSIS — Z419 Encounter for procedure for purposes other than remedying health state, unspecified: Secondary | ICD-10-CM | POA: Diagnosis not present

## 2022-01-15 ENCOUNTER — Emergency Department (HOSPITAL_COMMUNITY): Payer: Medicaid Other

## 2022-01-15 ENCOUNTER — Encounter (HOSPITAL_COMMUNITY): Payer: Self-pay | Admitting: Emergency Medicine

## 2022-01-15 ENCOUNTER — Other Ambulatory Visit: Payer: Self-pay

## 2022-01-15 ENCOUNTER — Emergency Department (HOSPITAL_COMMUNITY)
Admission: EM | Admit: 2022-01-15 | Discharge: 2022-01-15 | Disposition: A | Discharge status: Home / Self Care | Payer: Medicaid Other | Attending: Emergency Medicine | Admitting: Emergency Medicine

## 2022-01-15 DIAGNOSIS — N9489 Other specified conditions associated with female genital organs and menstrual cycle: Secondary | ICD-10-CM | POA: Insufficient documentation

## 2022-01-15 DIAGNOSIS — M5441 Lumbago with sciatica, right side: Secondary | ICD-10-CM | POA: Insufficient documentation

## 2022-01-15 DIAGNOSIS — M545 Low back pain, unspecified: Secondary | ICD-10-CM | POA: Diagnosis not present

## 2022-01-15 LAB — I-STAT BETA HCG BLOOD, ED (MC, WL, AP ONLY): I-stat hCG, quantitative: <5 m[IU]/mL (ref <5)

## 2022-01-15 MED ORDER — LIDOCAINE 5 % EX PTCH
1.0000 | MEDICATED_PATCH | Freq: Once | CUTANEOUS | Status: DC
  Administered 2022-01-15: 1 via TRANSDERMAL
  Filled 2022-01-15: qty 1

## 2022-01-15 MED ORDER — METHOCARBAMOL 500 MG PO TABS
500.0000 mg | ORAL_TABLET | Freq: Once | ORAL | Status: AC
  Administered 2022-01-15: 500 mg via ORAL
  Filled 2022-01-15: qty 1

## 2022-01-15 MED ORDER — NAPROXEN 500 MG PO TABS: 500.0000 mg | ORAL_TABLET | Freq: Two times a day (BID) | ORAL | 0 refills | Status: DC

## 2022-01-15 MED ORDER — KETOROLAC TROMETHAMINE 30 MG/ML IJ SOLN
30.0000 mg | Freq: Once | INTRAMUSCULAR | Status: AC
  Administered 2022-01-15: 30 mg via INTRAMUSCULAR
  Filled 2022-01-15: qty 1

## 2022-01-15 MED ORDER — METHOCARBAMOL 500 MG PO TABS: 500.0000 mg | ORAL_TABLET | Freq: Two times a day (BID) | ORAL | 0 refills | Status: DC

## 2022-01-15 MED ORDER — METHYLPREDNISOLONE 4 MG PO TBPK: ORAL_TABLET | ORAL | 0 refills | Status: DC

## 2022-01-15 MED ORDER — PREDNISONE 20 MG PO TABS
60.0000 mg | ORAL_TABLET | Freq: Once | ORAL | Status: AC
  Administered 2022-01-15: 60 mg via ORAL
  Filled 2022-01-15: qty 3

## 2022-01-15 NOTE — Discharge Instructions (Signed)
You were seen here today for Back Pain: Low back pain is discomfort in the lower back that may be due to injuries to muscles and ligaments around the spine. Occasionally, it may be caused by a problem to a part of the spine called a disc. Your back pain should be treated with medicines listed below as well as back exercises and this back pain should get better over the next 2 weeks. Most patients get completely well in 4 weeks. It is important to know however, if you develop severe or worsening pain, low back pain with fever, numbness, weakness or inability to walk or urinate, you should return to the ER immediately.  Please follow up with your doctor this week for a recheck if still having symptoms.  HOME INSTRUCTIONS Self - care:  The application of heat can help soothe the pain.  Maintaining your daily activities, including walking (this is encouraged), as it will help you get better faster than just staying in bed. Do not life, push, pull anything more than 10 pounds for the next week. I am attaching back exercises that you can do at home to help facilitate your recovery.   Back Exercises - I have attached a handout on back exercises that can be done at home to help facilitate your recovery.   Medications are also useful to help with pain control.   Acetaminophen.  This medication is generally safe, and found over the counter. Take as directed for your age. You should not take more than 8 of the extra strength (500mg ) pills a day (max dose is 4000mg  total OVER one day)  Non steroidal anti inflammatory: This includes medications including Ibuprofen, naproxen and Mobic; These medications help both pain and swelling and are very useful in treating back pain.  They should be taken with food, as they can cause stomach upset, and more seriously, stomach bleeding. Do not combine the medications.   Lidocaine Patch: Salon Pas lidocaine patches (blue and silver box) can be purchased over the counter and worn  for 12 hours for local pain relief   Muscle relaxants:  These medications can help with muscle tightness that is a cause of lower back pain.  Most of these medications can cause drowsiness, and it is not safe to drive or use dangerous machinery while taking them. They are primarily helpful when taken at night before sleep.  Prednisone/Medrol -  This is an oral steroid.  This medication is best taken with food in the morning.  Please note that this medication can cause anxiety, mood swings, muscle fatigue, increased hunger, weight gain (sodium/fluid retention), poor sleep as well as other symptoms. If you are a diabetic, please monitor your blood sugars at home as this medication can increase your blood sugars. Call your pharmacist if you have any questions.  You will need to follow up with your primary healthcare provider or the Orthopedist in 1-2 weeks for reassessment and persistent symptoms.  Be aware that if you develop new symptoms, such as a fever, leg weakness, difficulty with or loss of control of your urine or bowels, abdominal pain, or more severe pain, you will need to seek medical attention and/or return to the Emergency department. Additional Information:  Your vital signs today were: BP 107/60 (BP Location: Right Arm)   Pulse 80   Temp 98.3 F (36.8 C) (Oral)   Resp 18   Ht 5\' 1"  (1.549 m)   Wt 98 kg   SpO2 98%   BMI  40.82 kg/m  If your blood pressure (BP) was elevated above 135/85 this visit, please have this repeated by your doctor within one month. ---------------

## 2022-01-15 NOTE — ED Provider Notes (Signed)
[facility] EMERGENCY DEPARTMENT Provider Note   CSN: [id number] Arrival date & time: 01/15/22  5102     History  Chief Complaint  Patient presents with   Back Pain    Cassandra Moore is a 21 y.o. female.  Cassandra Moore is a 21 y.o. female with history of migraines and vertigo, who presents to the emergency department for evaluation of right-sided low back pain.  Patient reports pain has currently been present for about 6 months.  Had a prior episode of similar pain last year and saw an orthopedist, was prescribed muscle relaxers which seem to give some mild improvement.  She reports pain got worse this morning, and became so severe that she was having trouble getting up from the bathroom which is what prompted her visit to the ED today.  She reports intermittently she has some numbness and pins and needle sensation that goes down her right leg and reports that when pain becomes severe sometimes her leg buckles but she has not had any consistent weakness or numbness.  She reports that with certain movements in particular when she goes to lift her legs the pain shoots down the back of her right leg.  She denies any associated abdominal pain, nausea, vomiting or dysuria.  Patient is currently on her menstrual cycle.  No associated fevers or chills.  Denies history of IV drug use.  Denies personal history of cancer but does report a significant family history of cancer on both sides of her family.  Unsure of anyone that has had a spinal or bone cancer.  She reports that she has had to take NSAIDs fairly routinely to get any relief in pain, has not tried anything else to treat symptoms.  Previously saw an orthopedist and [place] and in [place] but does not have one locally.  The history is provided by the patient and a parent.      Home Medications Prior to Admission medications   Medication Sig Start Date End Date Taking? Authorizing Provider  methocarbamol (ROBAXIN) 500 MG  tablet Take 1 tablet (500 mg total) by mouth 2 (two) times daily. 01/15/22  Yes Dartha Lodge, PA-C  methylPREDNISolone (MEDROL DOSEPAK) 4 MG TBPK tablet Take as directed 01/15/22  Yes Dartha Lodge, PA-C  naproxen (NAPROSYN) 500 MG tablet Take 1 tablet (500 mg total) by mouth 2 (two) times daily with a meal. 01/15/22   Dartha Lodge, PA-C  sertraline (ZOLOFT) 50 MG tablet Take 75 mg by mouth daily.    [provider]  tiZANidine (ZANAFLEX) 4 MG tablet Take 1 tablet (4 mg total) by mouth every 8 (eight) hours as needed. 05/20/20   Wallis Bamberg, PA-C  TRAZODONE HCL ER PO Take 25 mg by mouth daily.    [provider]      Allergies    Patient has no known allergies.    Review of Systems   Review of Systems  Constitutional:  Negative for chills and fever.  HENT: Negative.    Respiratory:  Negative for cough and shortness of breath.   Cardiovascular:  Negative for chest pain.  Gastrointestinal:  Negative for abdominal pain, nausea and vomiting.  Genitourinary:  Negative for dysuria.  Musculoskeletal:  Positive for back pain. Negative for arthralgias and myalgias.  Skin:  Negative for color change and rash.  Neurological:  Negative for weakness and numbness.  All other systems reviewed and are negative.  Physical Exam Updated Vital Signs BP 107/60 (BP Location: Right  Arm)   Pulse 80   Temp 98.3 F (36.8 C) (Oral)   Resp 18   Ht  (1.549 m)   Wt 98 kg   SpO2 98%   BMI 40.82 kg/m  Physical Exam Vitals and nursing note reviewed.  Constitutional:      General: She is not in acute distress.    Appearance: Normal appearance. She is well-developed. She is not ill-appearing or diaphoretic.  HENT:     Head: Normocephalic and atraumatic.  Eyes:     General:        Right eye: No discharge.        Left eye: No discharge.  Cardiovascular:     Pulses:          Radial pulses are 2+ on the right side and 2+ on the left side.       Dorsalis pedis pulses are 2+ on the  right side and 2+ on the left side.       Posterior tibial pulses are 2+ on the right side and 2+ on the left side.  Pulmonary:     Effort: Pulmonary effort is normal. No respiratory distress.  Abdominal:     General: Bowel sounds are normal. There is no distension.     Palpations: Abdomen is soft. There is no mass.     Tenderness: There is no abdominal tenderness. There is no guarding.     Comments: Abdomen soft, nondistended, nontender to palpation in all quadrants without guarding or peritoneal signs, no CVA tenderness bilaterally  Musculoskeletal:     Cervical back: Neck supple.     Comments: Tenderness to palpation over the right lumbar paraspinal muscles ans extending into right buttock, no midline tenderness or step off.  Pain made worse with range of motion of the lower extremities, neg straight leg raise on exam  Skin:    General: Skin is warm and dry.     Capillary Refill: Capillary refill takes less than 2 seconds.  Neurological:     Mental Status: She is alert and oriented to person, place, and time.     Coordination: Coordination normal.     Comments: Alert, clear speech, following commands. Moving all extremities without difficulty. Bilateral lower extremities with 5/5 strength in proximal and distal muscle groups and with dorsi and plantar flexion. Sensation intact in bilateral lower extremities. 2+ patellar DTRs bilaterally. Ambulatory with steady gait  Psychiatric:        Mood and Affect: Mood normal.        Behavior: Behavior normal.    ED Results / Procedures / Treatments   Labs (all labs ordered are listed, but only abnormal results are displayed) Labs Reviewed  I-STAT BETA HCG BLOOD, ED ([facility], [facility], [facility] ONLY)    EKG None  Radiology CT Lumbar Spine Wo Contrast  Result Date: 01/15/2022 CLINICAL DATA:  Low back pain for greater than 6 weeks despite treatment. EXAM: CT LUMBAR SPINE WITHOUT CONTRAST TECHNIQUE: Multidetector CT imaging of the lumbar spine was  performed without intravenous contrast administration. Multiplanar CT image reconstructions were also generated. RADIATION DOSE REDUCTION: This exam was performed according to the departmental dose-optimization program which includes automated exposure control, adjustment of the mA and/or kV according to patient size and/or use of iterative reconstruction technique. COMPARISON:  None Available. FINDINGS: Segmentation: 5 non rib-bearing lumbar type vertebral bodies are present. The lowest fully formed vertebral body is L5. Alignment: No significant listhesis is present. Vertebrae: Vertebral body heights are normal. No  focal osseous lesions are present. Paraspinal and other soft tissues: Limited imaging the abdomen is unremarkable. There is no significant adenopathy. No solid organ lesions are present. Disc levels: Disc levels at L3-4 and above are normal. L4-5: A leftward disc bulge is present. No significant stenosis is evident. L5-S1: Degenerative facet changes are present bilaterally. Spurring from both facet joints results in moderate left and mild right foraminal stenosis. The central canal is patent. IMPRESSION: 1. Degenerative facet changes with spurring at L5-S1 result in moderate left and mild right foraminal stenosis. 2. Shallow leftward disc bulge at L4-5 without significant stenosis. Electronically Signed   By: Marin Roberts M.D.   On: 01/15/2022 12:04    Procedures Procedures    Medications Ordered in ED Medications  lidocaine (LIDODERM) 5 % 1 patch (1 patch Transdermal Patch Applied 01/15/22 1047)  ketorolac (TORADOL) 30 MG/ML injection 30 mg (30 mg Intramuscular Given 01/15/22 1048)  methocarbamol (ROBAXIN) tablet 500 mg (500 mg Oral Given 01/15/22 1045)  predniSONE (DELTASONE) tablet 60 mg (60 mg Oral Given 01/15/22 1044)    ED Course/ Medical Decision Making/ A&P                           Medical Decision Making Amount and/or Complexity of Data Reviewed Radiology:  ordered.  Risk Prescription drug management.   21 y.o. female presents to the ED with complaints of right low back pain, this involves an extensive number of treatment options, and is a complaint that carries with it a high risk of complications and morbidity.  The differential diagnosis includes muscle spasm, herniated disc, sciatica, radiculopathy, fracture, malignancy   On arrival pt is nontoxic, vitals WNL. Exam significant for no focal neurologic deficits or overlying skin changes  Additional history obtained from mom at bedside. Previous records obtained and reviewed   I ordered medication including: Robaxin, Toradol, Lidoderm and prednisone for low back pain  Lab Tests:  I Ordered, reviewed, and interpreted labs, which included: Negative pregnancy  Imaging Studies ordered:  I ordered imaging studies which included CT lumbar, I independently visualized and interpreted imaging which showed some degenerative changes, as noted in more detail by radiologist there are facet changes with spurring at L5-S1 with moderate left and mild right foraminal stenosis and a shallow left disc bulge at L4-5  ED Course:   I discussed CT imaging with patient and mother at bedside.  CT is overall reassuring.  No significant bony changes or disc herniation.  Symptoms improved with treatment here in the ED and patient has been ambulatory.  Remains neurologically intact.  Discussed recommendations for outpatient follow-up and continued symptomatic treatment as well as return precautions.  At this time there does not appear to be any evidence of an acute emergency medical condition requiring further emergent evaluation and the patient appears stable for discharge with appropriate outpatient follow up. Diagnosis and return precautions discussed with patient who verbalizes understanding and is agreeable to discharge.      Portions of this note were generated with Scientist, clinical (histocompatibility and immunogenetics). Dictation errors  may occur despite best attempts at proofreading.         Final Clinical Impression(s) / ED Diagnoses Final diagnoses:  Acute right-sided low back pain with right-sided sciatica    Rx / DC Orders ED Discharge Orders          Ordered    naproxen (NAPROSYN) 500 MG tablet  2 times daily with meals  01/15/22 1337    methocarbamol (ROBAXIN) 500 MG tablet  2 times daily        01/15/22 1337    methylPREDNISolone (MEDROL DOSEPAK) 4 MG TBPK tablet        01/15/22 1337              Dartha LodgeFord, Viral Schramm N, PA-C 01/15/22 1553    Melene PlanFloyd, Dan, DO 01/16/22 [id number]

## 2022-01-15 NOTE — ED Triage Notes (Signed)
Pt here POV with back pain to the lower R side shooting to her R leg for 6 months. Was seen by an orthopedic and prescribed muscle relaxers but the muscle relaxers ran out. Patient verbalizes this feel like nerve pain. 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 Pain 10/10.

## 2022-01-21 DIAGNOSIS — Z419 Encounter for procedure for purposes other than remedying health state, unspecified: Secondary | ICD-10-CM | POA: Diagnosis not present

## 2022-01-22 DIAGNOSIS — M545 Low back pain, unspecified: Secondary | ICD-10-CM | POA: Diagnosis not present

## 2022-01-22 DIAGNOSIS — M5416 Radiculopathy, lumbar region: Secondary | ICD-10-CM | POA: Diagnosis not present

## 2022-01-26 DIAGNOSIS — M5416 Radiculopathy, lumbar region: Secondary | ICD-10-CM | POA: Diagnosis not present

## 2022-01-26 DIAGNOSIS — M5451 Vertebrogenic low back pain: Secondary | ICD-10-CM | POA: Diagnosis not present

## 2022-02-08 DIAGNOSIS — M5416 Radiculopathy, lumbar region: Secondary | ICD-10-CM | POA: Diagnosis not present

## 2022-02-08 DIAGNOSIS — M5451 Vertebrogenic low back pain: Secondary | ICD-10-CM | POA: Diagnosis not present

## 2022-02-11 DIAGNOSIS — M5416 Radiculopathy, lumbar region: Secondary | ICD-10-CM | POA: Diagnosis not present

## 2022-02-11 DIAGNOSIS — M5451 Vertebrogenic low back pain: Secondary | ICD-10-CM | POA: Diagnosis not present

## 2022-02-16 DIAGNOSIS — M5451 Vertebrogenic low back pain: Secondary | ICD-10-CM | POA: Diagnosis not present

## 2022-02-16 DIAGNOSIS — M5416 Radiculopathy, lumbar region: Secondary | ICD-10-CM | POA: Diagnosis not present

## 2022-02-20 DIAGNOSIS — Z419 Encounter for procedure for purposes other than remedying health state, unspecified: Secondary | ICD-10-CM | POA: Diagnosis not present

## 2022-02-22 DIAGNOSIS — M5451 Vertebrogenic low back pain: Secondary | ICD-10-CM | POA: Diagnosis not present

## 2022-02-22 DIAGNOSIS — M5416 Radiculopathy, lumbar region: Secondary | ICD-10-CM | POA: Diagnosis not present

## 2022-02-25 DIAGNOSIS — M5451 Vertebrogenic low back pain: Secondary | ICD-10-CM | POA: Diagnosis not present

## 2022-02-25 DIAGNOSIS — M5416 Radiculopathy, lumbar region: Secondary | ICD-10-CM | POA: Diagnosis not present

## 2022-03-01 DIAGNOSIS — M5416 Radiculopathy, lumbar region: Secondary | ICD-10-CM | POA: Diagnosis not present

## 2022-03-01 DIAGNOSIS — M5451 Vertebrogenic low back pain: Secondary | ICD-10-CM | POA: Diagnosis not present

## 2022-03-02 DIAGNOSIS — M5459 Other low back pain: Secondary | ICD-10-CM | POA: Diagnosis not present

## 2022-03-02 DIAGNOSIS — M5416 Radiculopathy, lumbar region: Secondary | ICD-10-CM | POA: Diagnosis not present

## 2022-03-08 DIAGNOSIS — M5451 Vertebrogenic low back pain: Secondary | ICD-10-CM | POA: Diagnosis not present

## 2022-03-08 DIAGNOSIS — M5416 Radiculopathy, lumbar region: Secondary | ICD-10-CM | POA: Diagnosis not present

## 2022-03-09 ENCOUNTER — Emergency Department (HOSPITAL_COMMUNITY)
Admission: EM | Admit: 2022-03-09 | Discharge: 2022-03-09 | Discharge status: Against Medical Advice | Payer: Medicaid Other | Attending: Emergency Medicine | Admitting: Emergency Medicine

## 2022-03-09 ENCOUNTER — Other Ambulatory Visit: Payer: Self-pay

## 2022-03-09 ENCOUNTER — Emergency Department (HOSPITAL_COMMUNITY): Payer: Medicaid Other

## 2022-03-09 DIAGNOSIS — M545 Low back pain, unspecified: Secondary | ICD-10-CM | POA: Diagnosis not present

## 2022-03-09 DIAGNOSIS — Z5321 Procedure and treatment not carried out due to patient leaving prior to being seen by health care provider: Secondary | ICD-10-CM | POA: Diagnosis not present

## 2022-03-09 LAB — PREGNANCY, URINE: Preg Test, Ur: NEGATIVE

## 2022-03-09 MED ORDER — ACETAMINOPHEN 325 MG PO TABS
650.0000 mg | ORAL_TABLET | Freq: Once | ORAL | Status: AC
  Administered 2022-03-09: 650 mg via ORAL

## 2022-03-09 NOTE — ED Triage Notes (Signed)
Pt here with upper R back side, worse with movement and deep breathing. Denies urinary symptoms or numbness to lower extremities.

## 2022-03-09 NOTE — ED Provider Triage Note (Signed)
Emergency Medicine Provider Triage Evaluation Note  Cassandra Moore , a 21 y.o. female  was evaluated in triage.  Pt complains of right lower back pain already getting physical therapy for pain just below this area, worse with movement, no red flag symptoms, no recent injuries..  Review of Systems  Positive: Lower back pain Negative: Weakness  Physical Exam  BP 110/65 (BP Location: Right Arm)   Pulse 75   Temp 98.6 F (37 C) (Oral)   Resp 18   SpO2 100%  Gen:   Awake, no distress   Resp:  Normal effort  MSK:   Moves extremities without difficulty  Other:  To palpation right lower back  Medical Decision Making  Medically screening exam initiated at 3:58 PM.  Appropriate orders placed.  Cassandra Moore was informed that the remainder of the evaluation will be completed by another provider, this initial triage assessment does not replace that evaluation, and the importance of remaining in the ED until their evaluation is complete.  Work up [employer], [place], PA-C 03/09/22 1559

## 2022-03-09 NOTE — ED Notes (Signed)
Patient left.

## 2022-03-10 ENCOUNTER — Encounter: Payer: Self-pay | Admitting: Nurse Practitioner

## 2022-03-10 ENCOUNTER — Ambulatory Visit: Payer: Medicaid Other | Attending: Nurse Practitioner | Admitting: Nurse Practitioner

## 2022-03-10 VITALS — BP 102/70 | HR 70 | Wt 176.8 lb

## 2022-03-10 DIAGNOSIS — E7800 Pure hypercholesterolemia, unspecified: Secondary | ICD-10-CM

## 2022-03-10 DIAGNOSIS — Z862 Personal history of diseases of the blood and blood-forming organs and certain disorders involving the immune mechanism: Secondary | ICD-10-CM

## 2022-03-10 DIAGNOSIS — Z30011 Encounter for initial prescription of contraceptive pills: Secondary | ICD-10-CM | POA: Diagnosis not present

## 2022-03-10 DIAGNOSIS — G8929 Other chronic pain: Secondary | ICD-10-CM | POA: Diagnosis not present

## 2022-03-10 DIAGNOSIS — Z7689 Persons encountering health services in other specified circumstances: Secondary | ICD-10-CM | POA: Diagnosis not present

## 2022-03-10 DIAGNOSIS — M545 Low back pain, unspecified: Secondary | ICD-10-CM

## 2022-03-10 MED ORDER — NAPROXEN 500 MG PO TABS: 500.0000 mg | ORAL_TABLET | Freq: Two times a day (BID) | ORAL | 1 refills | Status: DC

## 2022-03-10 MED ORDER — NORGESTIM-ETH ESTRAD TRIPHASIC 0.18/0.215/0.25 MG-25 MCG PO TABS: 1.0000 | ORAL_TABLET | Freq: Every day | ORAL | 11 refills | Status: AC

## 2022-03-10 MED ORDER — METHOCARBAMOL 500 MG PO TABS: 500.0000 mg | ORAL_TABLET | Freq: Three times a day (TID) | ORAL | 1 refills | Status: DC | PRN

## 2022-03-10 MED ORDER — NORGESTIM-ETH ESTRAD TRIPHASIC 0.18/0.215/0.25 MG-25 MCG PO TABS: 1.0000 | ORAL_TABLET | Freq: Every day | ORAL | 11 refills | Status: DC

## 2022-03-10 NOTE — Progress Notes (Addendum)
Assessment & Plan:  Cassandra Moore was seen today for new patient (initial visit).  Diagnoses and all orders for this visit:  Encounter to establish care  Encounter for initial prescription of contraceptive pills -     Norgestimate-Ethinyl Estradiol Triphasic (ORTHO TRI-CYCLEN LO) 0.18/0.215/0.25 MG-25 MCG tab; Take 1 tablet by mouth daily. START ON SUNDAY  Hypercholesteremia -     Lipid panel  Chronic bilateral low back pain without sciatica -     methocarbamol (ROBAXIN) 500 MG tablet; Take 1 tablet (500 mg total) by mouth every 8 (eight) hours as needed for muscle spasms. -     naproxen (NAPROSYN) 500 MG tablet; Take 1 tablet (500 mg total) by mouth 2 (two) times daily with a meal. -     Basic metabolic panel Work on losing weight to help reduce back pain. May alternate with heat and ice application for pain relief. May also alternate with acetaminophen and Ibuprofen as prescribed for back pain. Other alternatives include massage, acupuncture and water aerobics.    History of anemia -     Iron, TIBC and Ferritin Panel -     CBC with Differential -     Basic metabolic panel    Patient has been counseled on age-appropriate routine health concerns for screening and prevention. These are reviewed and up-to-date. Referrals have been placed accordingly. Immunizations are up-to-date or declined.    Subjective:   Chief Complaint  Patient presents with   New Patient (Initial Visit)   HPI Cassandra Moore 21 y.o. female presents to office today accompanied by her mother and she is here to establish care.   Currently being followed [facility] for lumbar radiculopathy. She now has right sided scapular pain unrelated to any injury. Went to PT on Monday but states some of the therapy was too painful and she was unable to complete what was recommended that day  Contraception Counseling: Patient presents for contraception counseling. The patient has no complaints today. The patient is not  sexually active. Pertinent past medical history: none. She would like to start OCPs today. Currently on her menstrual cycle so we did not perform pregnancy test today    She has a history of elevated total cholesterol as well as IDA.   Review of Systems  Constitutional:  Negative for fever, malaise/fatigue and weight loss.  HENT: Negative.  Negative for nosebleeds.   Eyes: Negative.  Negative for blurred vision, double vision and photophobia.  Respiratory: Negative.  Negative for cough and shortness of breath.   Cardiovascular: Negative.  Negative for chest pain, palpitations and leg swelling.  Gastrointestinal: Negative.  Negative for heartburn, nausea and vomiting.  Musculoskeletal:  Positive for back pain and myalgias.  Neurological: Negative.  Negative for dizziness, focal weakness, seizures and headaches.  Psychiatric/Behavioral: Negative.  Negative for suicidal ideas.     Past Medical History:  Diagnosis Date   Migraine    Migraines    Vertigo    Vertigo     Past Surgical History:  Procedure Laterality Date   NO PAST SURGERIES      Family History  Problem Relation Age of Onset   Depression Mother     Social History Reviewed with no changes to be made today.   Outpatient Medications Prior to Visit  Medication Sig Dispense Refill   methylPREDNISolone (MEDROL DOSEPAK) 4 MG TBPK tablet Take as directed 21 tablet 0   methocarbamol (ROBAXIN) 500 MG tablet Take 1 tablet (500 mg total) by mouth 2 (  two) times daily. 20 tablet 0   naproxen (NAPROSYN) 500 MG tablet Take 1 tablet (500 mg total) by mouth 2 (two) times daily with a meal. 30 tablet 0   sertraline (ZOLOFT) 50 MG tablet Take 75 mg by mouth daily.     tiZANidine (ZANAFLEX) 4 MG tablet Take 1 tablet (4 mg total) by mouth every 8 (eight) hours as needed. 30 tablet 0   TRAZODONE HCL ER PO Take 25 mg by mouth daily.     No facility-administered medications prior to visit.    No Known Allergies     Objective:     BP 102/70   Pulse 70   Wt 176 lb 12.8 oz (80.2 kg)   LMP 03/08/2022 (Exact Date)   SpO2 98%   BMI 33.41 kg/m  Wt Readings from Last 3 Encounters:  03/10/22 176 lb 12.8 oz (80.2 kg)  01/15/22 216 lb 0.8 oz (98 kg)  07/26/17 216 lb 12.8 oz (98.3 kg) (99 %, Z= 2.20)*   * Growth percentiles are based on CDC (Girls, 2-20 Years) data.    Physical Exam Vitals and nursing note reviewed.  Constitutional:      Appearance: She is well-developed.  HENT:     Head: Normocephalic and atraumatic.  Cardiovascular:     Rate and Rhythm: Normal rate and regular rhythm.     Heart sounds: Normal heart sounds. No murmur heard.    No friction rub. No gallop.  Pulmonary:     Effort: Pulmonary effort is normal. No tachypnea or respiratory distress.     Breath sounds: Normal breath sounds. No decreased breath sounds, wheezing, rhonchi or rales.  Chest:     Chest wall: No tenderness.  Abdominal:     General: Bowel sounds are normal.     Palpations: Abdomen is soft.  Musculoskeletal:        General: Normal range of motion.     Cervical back: Normal range of motion.  Skin:    General: Skin is warm and dry.  Neurological:     Mental Status: She is alert and oriented to person, place, and time.     Coordination: Coordination normal.  Psychiatric:        Behavior: Behavior normal. Behavior is cooperative.        Thought Content: Thought content normal.        Judgment: Judgment normal.          Patient has been counseled extensively about nutrition and exercise as well as the importance of adherence with medications and regular follow-up. The patient was given clear instructions to go to ER or return to medical center if symptoms don't improve, worsen or new problems develop. The patient verbalized understanding.   Follow-up: Return for Physical ONLY no labs.   Claiborne Rigg, FNP-BC [facility] and [facility] [place], [place] [phone]   03/10/2022, 11:01 AM

## 2022-03-11 DIAGNOSIS — M5451 Vertebrogenic low back pain: Secondary | ICD-10-CM | POA: Diagnosis not present

## 2022-03-15 DIAGNOSIS — M5451 Vertebrogenic low back pain: Secondary | ICD-10-CM | POA: Diagnosis not present

## 2022-03-15 DIAGNOSIS — M5416 Radiculopathy, lumbar region: Secondary | ICD-10-CM | POA: Diagnosis not present

## 2022-03-23 DIAGNOSIS — Z419 Encounter for procedure for purposes other than remedying health state, unspecified: Secondary | ICD-10-CM | POA: Diagnosis not present

## 2022-03-23 DIAGNOSIS — M5431 Sciatica, right side: Secondary | ICD-10-CM | POA: Diagnosis not present

## 2022-03-23 DIAGNOSIS — M5416 Radiculopathy, lumbar region: Secondary | ICD-10-CM | POA: Diagnosis not present

## 2022-05-03 ENCOUNTER — Ambulatory Visit: Payer: Medicaid Other | Attending: Nurse Practitioner

## 2022-05-03 ENCOUNTER — Ambulatory Visit: Payer: Medicaid Other

## 2022-05-03 ENCOUNTER — Telehealth: Payer: Self-pay

## 2022-05-03 DIAGNOSIS — Z862 Personal history of diseases of the blood and blood-forming organs and certain disorders involving the immune mechanism: Secondary | ICD-10-CM | POA: Diagnosis not present

## 2022-05-03 DIAGNOSIS — N926 Irregular menstruation, unspecified: Secondary | ICD-10-CM

## 2022-05-03 DIAGNOSIS — E7800 Pure hypercholesterolemia, unspecified: Secondary | ICD-10-CM | POA: Diagnosis not present

## 2022-05-03 DIAGNOSIS — M545 Low back pain, unspecified: Secondary | ICD-10-CM | POA: Diagnosis not present

## 2022-05-03 DIAGNOSIS — G8929 Other chronic pain: Secondary | ICD-10-CM | POA: Diagnosis not present

## 2022-05-03 NOTE — Telephone Encounter (Signed)
Open in error

## 2022-05-04 LAB — CBC WITH DIFFERENTIAL/PLATELET
Basophils Absolute: 0.1 10*3/uL (ref 0.0–0.2)
Basos: 1 %
EOS (ABSOLUTE): 0.3 10*3/uL (ref 0.0–0.4)
Eos: 5 %
Hematocrit: 34.2 % (ref 34.0–46.6)
Hemoglobin: 11.1 g/dL (ref 11.1–15.9)
Immature Grans (Abs): 0 10*3/uL (ref 0.0–0.1)
Immature Granulocytes: 0 %
Lymphocytes Absolute: 2.5 10*3/uL (ref 0.7–3.1)
Lymphs: 36 %
MCH: 29.6 pg (ref 26.6–33.0)
MCHC: 32.5 g/dL (ref 31.5–35.7)
MCV: 91 fL (ref 79–97)
Monocytes Absolute: 0.7 10*3/uL (ref 0.1–0.9)
Monocytes: 10 %
Neutrophils Absolute: 3.4 10*3/uL (ref 1.4–7.0)
Neutrophils: 48 %
Platelets: 342 10*3/uL (ref 150–450)
RBC: 3.75 x10E6/uL — ABNORMAL LOW (ref 3.77–5.28)
RDW: 13.1 % (ref 11.7–15.4)
WBC: 7.1 10*3/uL (ref 3.4–10.8)

## 2022-05-04 LAB — LIPID PANEL
Chol/HDL Ratio: 3 ratio (ref 0.0–4.4)
Cholesterol, Total: 155 mg/dL (ref 100–199)
HDL: 52 mg/dL (ref >39)
LDL Chol Calc (NIH): 92 mg/dL (ref 0–99)
Triglycerides: 53 mg/dL (ref 0–149)
VLDL Cholesterol Cal: 11 mg/dL (ref 5–40)

## 2022-05-04 LAB — IRON,TIBC AND FERRITIN PANEL
Ferritin: 17 ng/mL (ref 15–150)
Iron Saturation: 10 % — ABNORMAL LOW (ref 15–55)
Iron: 28 ug/dL (ref 27–159)
Total Iron Binding Capacity: 294 ug/dL (ref 250–450)
UIBC: 266 ug/dL (ref 131–425)

## 2022-05-04 LAB — BASIC METABOLIC PANEL
BUN/Creatinine Ratio: 17 (ref 9–23)
BUN: 12 mg/dL (ref 6–20)
CO2: 21 mmol/L (ref 20–29)
Calcium: 9.2 mg/dL (ref 8.7–10.2)
Chloride: 105 mmol/L (ref 96–106)
Creatinine, Ser: 0.69 mg/dL (ref 0.57–1.00)
Glucose: 81 mg/dL (ref 70–99)
Potassium: 4.2 mmol/L (ref 3.5–5.2)
Sodium: 143 mmol/L (ref 134–144)
eGFR: 127 mL/min/{1.73_m2} (ref >59)

## 2022-05-04 LAB — HUMAN CHORIONIC GONADOTROPIN(HCG),B-SUBUNIT,QUANTITATIVE): HCG, Beta Chain, Quant, S: <1 m[IU]/mL

## 2022-06-15 ENCOUNTER — Ambulatory Visit: Payer: Self-pay | Admitting: *Deleted

## 2022-06-15 NOTE — Telephone Encounter (Signed)
Summary: weight loss no appetite   Patient called in states, no appetite, and lost 20lbs in 2 months and sleeps a lot, also says her labs show she may be anemic and was wondering if Dr Raul Del is going to give her medicine for it.        Chief Complaint: weight loss, approx 15-20 lbs in 2 months  Symptoms: lightheaded  dizziness at least once a day. See "dots" in vision at times. Denies passing out or fainting. Reports feeling hot then cold. Drinks plenty of water during the day. No appetite. Forgets to eat. Reports she does not feel constipated but takes more than 1 time to finish having BMs.  Frequency: 2 months  Pertinent Negatives: Patient denies difficulty breathing no fever, no fainting. Disposition: [] ED /[] Urgent Care (no appt availability in office) / [x] Appointment(In office/virtual)/ []  [facility]/ [] Home Care/ [] Refused Recommended Disposition /[] [facility]/ [x]  Follow-up with PCP Additional Notes:   Earliest appt 07/21/22 . Please advise if earlier appt can be scheduled. Requesting if medication can be given.      Reason for Disposition  MODERATE unexplained weight loss (e.g., 5%, 8 to 10 pounds [4 - 5 kg] in person who weighs 170 to 200 pounds [75 - 90 kg])  Answer Assessment - Initial Assessment Questions 1. MAIN CONCERN: "What is your main concern today?"     Lost weight atleast 15 - 20 lbs in 2 months  2. WEIGHT LOSS: "How much weight have you lost?"  (e.g., lbs., kgs.)  "Over what period of time have you lost this weight?"  (e.g., number of days, weeks, months, years)     15-20 lbs 3. BASELINE WEIGHT: "What is your baseline or normal weight?" (e.g., "How much do you usually weigh?")     Now 157 lbs 4. CAUSE: "What do you think is causing the weight loss?" (e.g., depression, anxiety, medicine side effect, pain, trouble swallowing, substance or alcohol use problem, eating disorder)     Have been feeling "down" no energy  5. PRIOR EVALUATION:  "Have you been evaluated by a doctor for your weight loss?" If Yes, ask "When was your last visit?" "What did your doctor (or NP/PA) tell you about the possible cause?"     no 6. HEART FAILURE TREATMENT: "Do you have heart failure?" If Yes, ask: "Have you taken new or extra water pills (diuretics) recently?" (e.g., furosemide; bumetanide). "What is your target weight?"     na 7. OTHER SYMPTOMS: "Do you have any other symptoms?" (e.g., anxiety or depression, blood in stool, breathing difficulty, diarrhea, fever, trouble swallowing)     More constipation noted ,takes more than 1 trip to bathroom to finish.  8. PREGNANCY: "Is there any chance you are pregnant?" "When was your last menstrual period?"     na  Protocols used: Weight Loss - Unintended-A-AH

## 2022-06-16 ENCOUNTER — Ambulatory Visit: Payer: Self-pay | Admitting: *Deleted

## 2022-06-16 NOTE — Telephone Encounter (Signed)
See addended note 06/15/22. Patient called back after a missed call today . Message given

## 2022-06-16 NOTE — Telephone Encounter (Signed)
Patient returned call. Missed today at 1330. Reviewed message LVM no earlier appt in office and advise mobile bus or wait until upcoming appt. Gave patient address for mobile bus today. Patient aware 1st come 1st serve. Appt for 07/21/22 confirmed to keep.

## 2022-06-16 NOTE — Telephone Encounter (Signed)
Left vm to inform pt there eare no eariler appts in off and will advise mobile bus or wait until upcoming appt.

## 2022-07-21 ENCOUNTER — Ambulatory Visit: Payer: Medicaid Other | Attending: Physician Assistant | Admitting: Physician Assistant

## 2022-07-21 ENCOUNTER — Other Ambulatory Visit: Payer: Self-pay

## 2022-07-21 ENCOUNTER — Encounter: Payer: Self-pay | Admitting: Physician Assistant

## 2022-07-21 VITALS — BP 97/64 | HR 91 | Temp 98.6°F | Ht 61.0 in | Wt 175.0 lb

## 2022-07-21 DIAGNOSIS — Z862 Personal history of diseases of the blood and blood-forming organs and certain disorders involving the immune mechanism: Secondary | ICD-10-CM

## 2022-07-21 DIAGNOSIS — R031 Nonspecific low blood-pressure reading: Secondary | ICD-10-CM | POA: Diagnosis not present

## 2022-07-21 DIAGNOSIS — R42 Dizziness and giddiness: Secondary | ICD-10-CM | POA: Diagnosis not present

## 2022-07-21 DIAGNOSIS — D649 Anemia, unspecified: Secondary | ICD-10-CM | POA: Insufficient documentation

## 2022-07-21 DIAGNOSIS — R0602 Shortness of breath: Secondary | ICD-10-CM | POA: Diagnosis not present

## 2022-07-21 DIAGNOSIS — R55 Syncope and collapse: Secondary | ICD-10-CM | POA: Diagnosis present

## 2022-07-21 DIAGNOSIS — I498 Other specified cardiac arrhythmias: Secondary | ICD-10-CM | POA: Diagnosis not present

## 2022-07-21 NOTE — Progress Notes (Signed)
Patient ID: Cassandra Moore, female   DOB: [birth year], 21 y.o.   MRN: [id number]   Cassandra Moore, is a 21 y.o. female  [id number]  [id number]  DOB - [birth year]  Chief Complaint  Patient presents with   Dizziness    Weight loss, lightheaded.  Episode of hearing loss, "fainting" feeling, hot & cold flashes Xlast week.  Back pain on lower R side, poss pulled muscle.        Subjective:   Cassandra Moore is a 21 y.o. female here today for Ms [place] is c/o near syncope twice in one day last week.  She says she had eaten that day.  No CP.  She felt some SOB but thinks that is bc she panicked a little.  She denied any CP.  It occurred when she stood up quickly to help a colleague at work and then again a few mins later when she stood up to help a customer.  Prior to this occurring she noticed spots in her vision, muffled hearing and felt light-headed.     Sometimes skips meals or gets in a hurry and doesn't eat/drink enough.  Denies food restricting.  No FH early sudden death.  Dad with MI and heart problems starting at age 75.  No symptoms currently/feels fine.  She has not eaten anything today and has not drank any water.  Her mom is here with her today.  The patient has h/o anemia and is now on BCP for 2 months and taking PNV with iron.  LMP was 2 weeks ago and denies possibility of pregnancy.  BPs have historically been on the low side.  She has not had problems with exercise in the past      07/21/2022   11:21 AM 03/10/2022   10:00 AM 03/09/2022    3:51 PM  Vitals with BMI  Height 5\' 1"     Weight 175 lbs 176 lbs 13 oz   BMI 33.08    Systolic 93 102 110  Diastolic 62 70 65  Pulse 91 70 75   No problems updated.  ALLERGIES: No Known Allergies  PAST MEDICAL HISTORY: Past Medical History:  Diagnosis Date   Migraine    Migraines    Vertigo    Vertigo     MEDICATIONS AT HOME: Prior to Admission medications   Medication Sig Start Date End Date Taking? Authorizing Provider   methocarbamol (ROBAXIN) 500 MG tablet Take 1 tablet (500 mg total) by mouth every 8 (eight) hours as needed for muscle spasms. 03/10/22  Yes 03/12/22, NP  naproxen (NAPROSYN) 500 MG tablet Take 1 tablet (500 mg total) by mouth 2 (two) times daily with a meal. 03/10/22  Yes 03/12/22, NP  Norgestimate-Ethinyl Estradiol Triphasic (ORTHO TRI-CYCLEN LO) 0.18/0.215/0.25 MG-25 MCG tab Take 1 tablet by mouth daily. START ON SUNDAY 03/10/22  Yes 03/12/22, NP  methylPREDNISolone (MEDROL DOSEPAK) 4 MG TBPK tablet Take as directed Patient not taking: Reported on 07/21/2022 01/15/22   01/17/22, PA-C    ROS: Neg HEENT Neg resp Neg GI Neg GU Neg MS Neg psych  Objective:   Vitals:   07/21/22 1121  BP: 93/62  Pulse: 91  Temp: 98.6 F (37 C)  TempSrc: Oral  SpO2: 98%  Weight: 175 lb (79.4 kg)  Height: 5\' 1"  (1.549 m)   Exam General appearance : Awake, alert, not in any distress. Speech Clear. Not toxic looking HEENT: Atraumatic and Normocephalic Neck: Supple, no JVD. No cervical  lymphadenopathy. No thyromegaly Chest: Good air entry bilaterally, CTAB.  No rales/rhonchi/wheezing CVS: S1 S2 regular, no murmurs.  Extremities: B/L Lower Ext shows no edema, both legs are warm to touch Neurology: Awake alert, and oriented X 3, CN II-XII intact, Non focal Skin: No Rash  Data Review No results found for: "HGBA1C"  Assessment & Plan   1. Near syncope No red flags Likely poor food and water intake.  Will refer for further workup - TSH - Comprehensive metabolic panel - Ambulatory referral to Cardiology  2. History of anemia On PNV with iron - CBC with Differential/Platelet - Iron, TIBC and Ferritin Panel  3. Low blood pressure reading Increase food and water intake - Ambulatory referral to Cardiology  BP recheck 97/64 EKG with sinus arrhythmia(she and her mom say she has had this since childhood) and no ST changes  Return in about 4 months (around  11/19/2022) for with PCP for health maintenace .  The patient was given clear instructions to go to ER or return to medical center if symptoms don't improve, worsen or new problems develop. The patient verbalized understanding. The patient was told to call to get lab results if they haven't heard anything in the next week.      Georgian Co, PA-C [facility] and [facility] [place], [place] [phone]   07/21/2022, 11:59 AM

## 2022-07-21 NOTE — Patient Instructions (Signed)
Drink 80-100 ounces water daily  Eat foods with protein and make sure you are eating frequently and NOT skipping meals.    Near-Syncope Near-syncope is when you suddenly feel like you might pass out or faint. This may also be called presyncope. During an episode of near-syncope, you may: Feel dizzy, weak, or light-headed. It may feel like the room is spinning. Feel like you may vomit (nauseous). See spots or see all white or all black. Have cold, clammy skin. Feel warm and sweaty. Hear ringing in your ears. This condition is caused by a sudden decrease in blood flow to the brain. This can result from many causes, but most of those causes are not dangerous. However, near-syncope may be a sign of a serious medical problem, so it is important to seek medical care. Follow these instructions at home: Medicines Take over-the-counter and prescription medicines only as told by your doctor. If you are taking blood pressure or heart medicine, get up slowly and spend many minutes getting ready to sit and then stand. This can help with dizziness. Lifestyle Do not drive, use machinery, or play sports until your doctor says it is okay. Do not drink alcohol. Do not smoke or use any products that contain nicotine or tobacco. If you need help quitting, ask your doctor. Avoid hot tubs and saunas. General instructions Be aware of any changes in your symptoms. Talk with your doctor about your symptoms. You may need to have testing to help find the cause. If you start to feel like you might pass out, sit or lie down right away. If sitting, lower your head down between your legs. If lying down, raise (elevate) your feet above the level of your heart. Breathe deeply and steadily. Wait until all of the symptoms are gone. Have someone stay with you until you feel better. Drink enough fluid to keep your pee (urine) pale yellow. Avoid standing for a long time. If you must stand for a long time, do movements such  as: Moving your legs. Crossing your legs. Flexing and stretching your leg muscles. Squatting. Keep all follow-up visits. Contact a doctor if: You continue to have episodes of near fainting. Get help right away if: You pass out or faint. You have any of these symptoms: Fast or uneven heartbeats (palpitations). Pain in your chest, belly, or back. Shortness of breath. You have a seizure. You have a very bad headache. You are confused. You have trouble seeing. You are very weak. You have trouble walking. You are bleeding from your mouth or butt. You have black or tarry poop (stool). These symptoms may be an emergency. Get help right away. Call your local emergency services (911 in the [place] Do not wait to see if the symptoms will go away. Do not drive yourself to the hospital. Summary Near-syncope is when you suddenly feel like you might pass out or faint. This condition is caused by a sudden decrease in blood flow to the brain. Near-syncope may be a sign of a serious medical problem, so it is important to seek medical care. If you start to feel like you might pass out, sit or lie down right away. If sitting, lower your head down between your legs. If lying down, raise (elevate) your feet above the level of your heart. Talk with your doctor about your symptoms. You may need to have testing to help find the cause. This information is not intended to replace advice given to you by your health care provider.  Make sure you discuss any questions you have with your health care provider. Document Revised: 12/18/2020 Document Reviewed: 12/18/2020 Elsevier Patient Education  2023 [employer].

## 2022-07-22 LAB — IRON,TIBC AND FERRITIN PANEL
Ferritin: 22 ng/mL (ref 15–150)
Iron Saturation: 22 % (ref 15–55)
Iron: 60 ug/dL (ref 27–159)
Total Iron Binding Capacity: 270 ug/dL (ref 250–450)
UIBC: 210 ug/dL (ref 131–425)

## 2022-07-22 LAB — COMPREHENSIVE METABOLIC PANEL
ALT: 9 IU/L (ref 0–32)
AST: 11 IU/L (ref 0–40)
Albumin/Globulin Ratio: 1.5 (ref 1.2–2.2)
Albumin: 4 g/dL (ref 4.0–5.0)
Alkaline Phosphatase: 43 IU/L — ABNORMAL LOW (ref 44–121)
BUN/Creatinine Ratio: 20 (ref 9–23)
BUN: 12 mg/dL (ref 6–20)
Bilirubin Total: <0.2 mg/dL (ref 0.0–1.2)
CO2: 23 mmol/L (ref 20–29)
Calcium: 9.1 mg/dL (ref 8.7–10.2)
Chloride: 102 mmol/L (ref 96–106)
Creatinine, Ser: 0.61 mg/dL (ref 0.57–1.00)
Globulin, Total: 2.7 g/dL (ref 1.5–4.5)
Glucose: 72 mg/dL (ref 70–99)
Potassium: 4.3 mmol/L (ref 3.5–5.2)
Sodium: 137 mmol/L (ref 134–144)
Total Protein: 6.7 g/dL (ref 6.0–8.5)
eGFR: 130 mL/min/{1.73_m2} (ref >59)

## 2022-07-22 LAB — CBC WITH DIFFERENTIAL/PLATELET
Basophils Absolute: 0.1 10*3/uL (ref 0.0–0.2)
Basos: 1 %
EOS (ABSOLUTE): 0.3 10*3/uL (ref 0.0–0.4)
Eos: 4 %
Hematocrit: 34.1 % (ref 34.0–46.6)
Hemoglobin: 11.1 g/dL (ref 11.1–15.9)
Immature Grans (Abs): 0 10*3/uL (ref 0.0–0.1)
Immature Granulocytes: 0 %
Lymphocytes Absolute: 1.6 10*3/uL (ref 0.7–3.1)
Lymphs: 26 %
MCH: 28.5 pg (ref 26.6–33.0)
MCHC: 32.6 g/dL (ref 31.5–35.7)
MCV: 88 fL (ref 79–97)
Monocytes Absolute: 0.5 10*3/uL (ref 0.1–0.9)
Monocytes: 8 %
Neutrophils Absolute: 3.7 10*3/uL (ref 1.4–7.0)
Neutrophils: 61 %
Platelets: 407 10*3/uL (ref 150–450)
RBC: 3.89 x10E6/uL (ref 3.77–5.28)
RDW: 13.4 % (ref 11.7–15.4)
WBC: 6.1 10*3/uL (ref 3.4–10.8)

## 2022-07-22 LAB — TSH: TSH: 1.79 u[IU]/mL (ref 0.450–4.500)

## 2022-09-01 ENCOUNTER — Ambulatory Visit: Payer: Medicaid Other | Attending: Cardiology | Admitting: Cardiology

## 2022-09-01 NOTE — Progress Notes (Deleted)
Cardiology Office Note:    Date:  09/01/2022   ID:  Cassandra Moore, DOB 12-14-2000, MRN [id number]  PCP:  Gildardo Pounds, NP  Cardiologist:  None  Electrophysiologist:  None   Referring MD: Argentina Donovan, PA-C   No chief complaint on file. ***  History of Present Illness:    Cassandra Moore is a 22 y.o. female with a hx of migraines who is referred by Freeman Caldron, PA for evaluation of near syncope.  Past Medical History:  Diagnosis Date   Migraine    Migraines    Vertigo    Vertigo     Past Surgical History:  Procedure Laterality Date   NO PAST SURGERIES      Current Medications: No outpatient medications have been marked as taking for the 09/01/22 encounter (Appointment) with Donato Heinz, MD.     Allergies:   Patient has no known allergies.   Social History   Socioeconomic History   Marital status: Single    Spouse name: Not on file   Number of children: Not on file   Years of education: Not on file   Highest education level: Not on file  Occupational History   Not on file  Tobacco Use   Smoking status: Never   Smokeless tobacco: Never  Vaping Use   Vaping Use: Some days  Substance and Sexual Activity   Alcohol use: Yes    Alcohol/week: 1.0 - 2.0 standard drink of alcohol    Types: 1 - 2 Shots of liquor per week    Comment: at events   Drug use: Yes    Types: Marijuana   Sexual activity: Never  Other Topics Concern   Not on file  Social History Narrative   Not on file   Social Determinants of Health   Financial Resource Strain: Not on file  Food Insecurity: Not on file  Transportation Needs: Not on file  Physical Activity: Not on file  Stress: Not on file  Social Connections: Not on file     Family History: The patient's ***family history includes Depression in her mother.  ROS:   Please see the history of present illness.    *** All other systems reviewed and are negative.  EKGs/Labs/Other Studies Reviewed:    The  following studies were reviewed today: ***  EKG:  EKG is *** ordered today.  The ekg ordered today demonstrates ***  Recent Labs: 07/21/2022: ALT 9; BUN 12; Creatinine, Ser 0.61; Hemoglobin 11.1; Platelets 407; Potassium 4.3; Sodium 137; TSH 1.790  Recent Lipid Panel    Component Value Date/Time   CHOL 155 05/03/2022 0835   TRIG 53 05/03/2022 0835   HDL 52 05/03/2022 0835   CHOLHDL 3.0 05/03/2022 0835   LDLCALC 92 05/03/2022 0835    Physical Exam:    VS:  There were no vitals taken for this visit.    Wt Readings from Last 3 Encounters:  07/21/22 175 lb (79.4 kg)  03/10/22 176 lb 12.8 oz (80.2 kg)  01/15/22 216 lb 0.8 oz (98 kg)     GEN: *** Well nourished, well developed in no acute distress HEENT: Normal NECK: No JVD; No carotid bruits LYMPHATICS: No lymphadenopathy CARDIAC: ***RRR, no murmurs, rubs, gallops RESPIRATORY:  Clear to auscultation without rales, wheezing or rhonchi  ABDOMEN: Soft, non-tender, non-distended MUSCULOSKELETAL:  No edema; No deformity  SKIN: Warm and dry NEUROLOGIC:  Alert and oriented x 3 PSYCHIATRIC:  Normal affect   ASSESSMENT:    No diagnosis  found. PLAN:    Near syncope:  RTC in***   Medication Adjustments/Labs and Tests Ordered: Current medicines are reviewed at length with the patient today.  Concerns regarding medicines are outlined above.  No orders of the defined types were placed in this encounter.  No orders of the defined types were placed in this encounter.   There are no Patient Instructions on file for this visit.   Signed, Donato Heinz, MD  09/01/2022 5:34 AM    [place]

## 2022-11-19 ENCOUNTER — Ambulatory Visit: Payer: Medicaid Other | Admitting: Nurse Practitioner

## 2022-11-24 ENCOUNTER — Ambulatory Visit: Payer: Medicaid Other | Admitting: Nurse Practitioner

## 2023-01-03 ENCOUNTER — Ambulatory Visit: Payer: Medicaid Other | Admitting: Nurse Practitioner

## 2023-08-04 IMAGING — CT CT L SPINE W/O CM
3 of 5 series · 14 of 33 positions shown, 16 images · non-contrast
Comparison: None Available.

CLINICAL DATA: Low back pain for greater than 6 weeks despite
treatment.



[Series 4: l spine soft · axial · 0.31mm/px · z∈[-293,-93]mm · 8 of 120 slices shown, 10 images]
[im 10/120  soft-tissue]
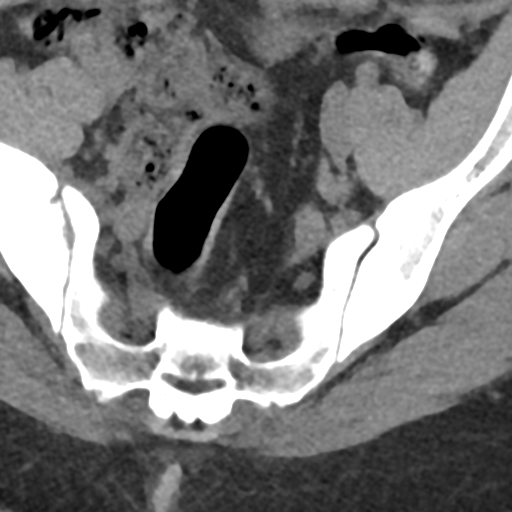
[im 10/120  bone]
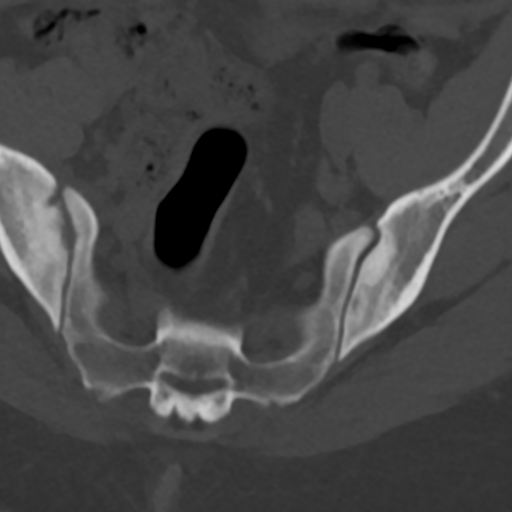
[im 28/120  bone]
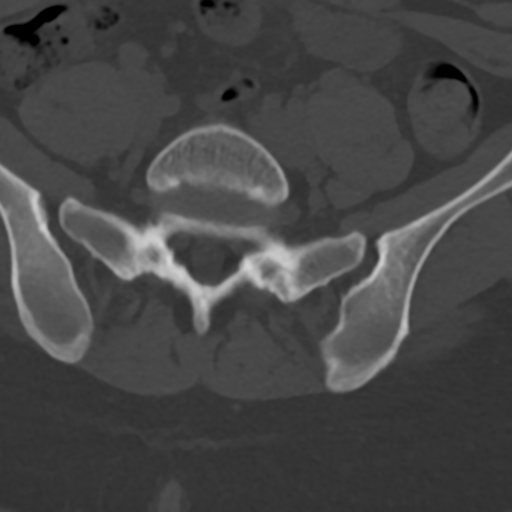
[im 37/120  bone]
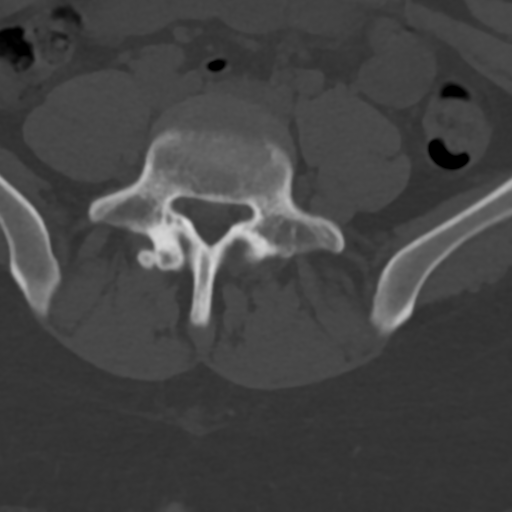
[im 55/120  bone]
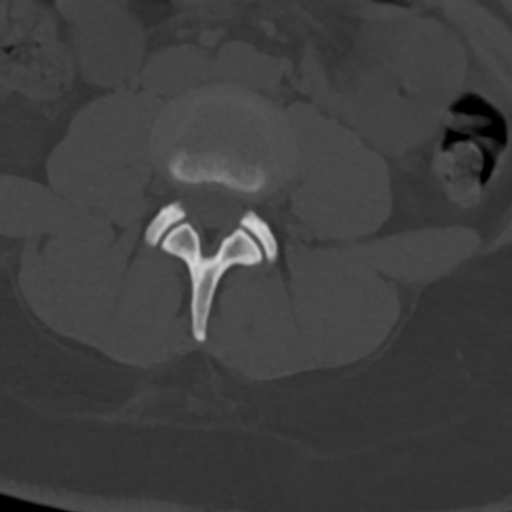
[im 65/120  soft-tissue]
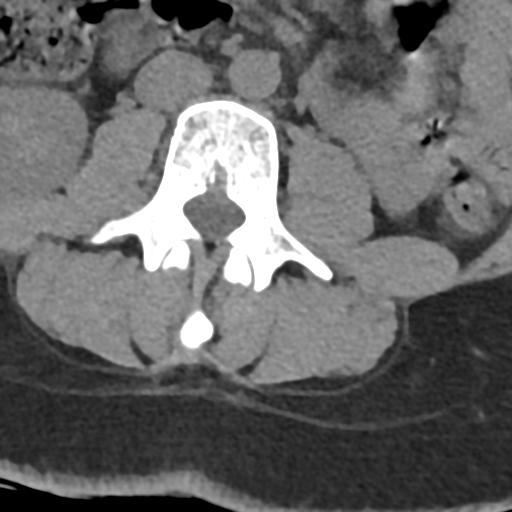
[im 65/120  bone]
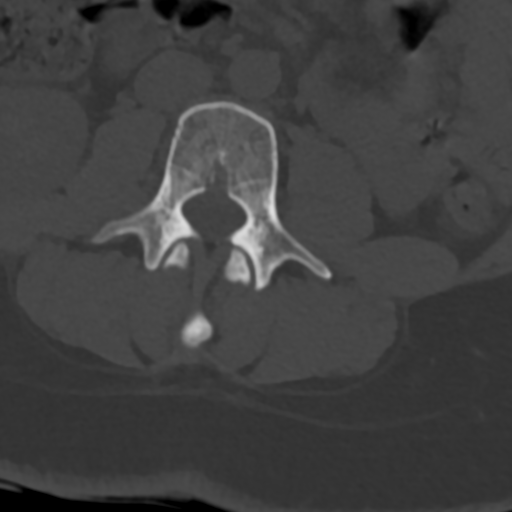
[im 83/120  bone]
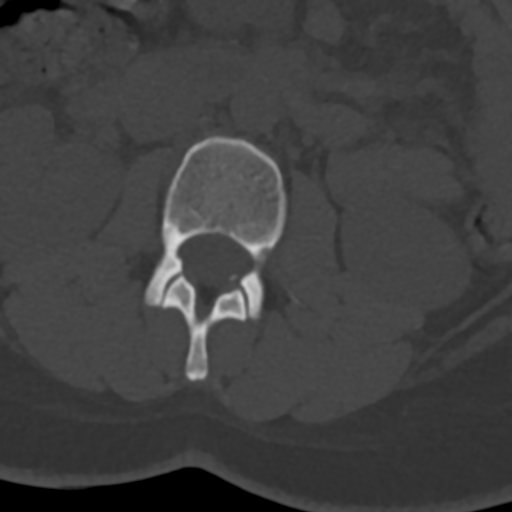
[im 92/120  bone]
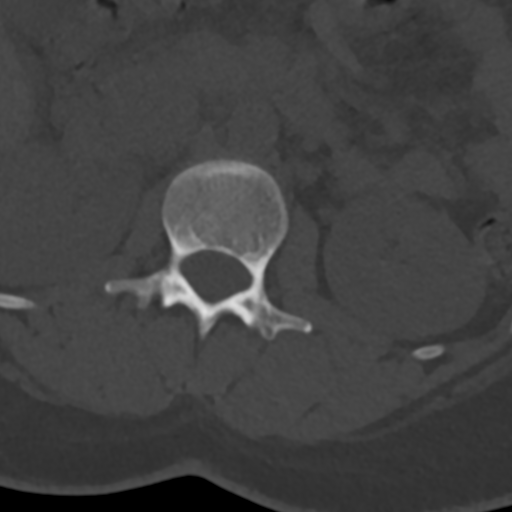
[im 110/120  bone]
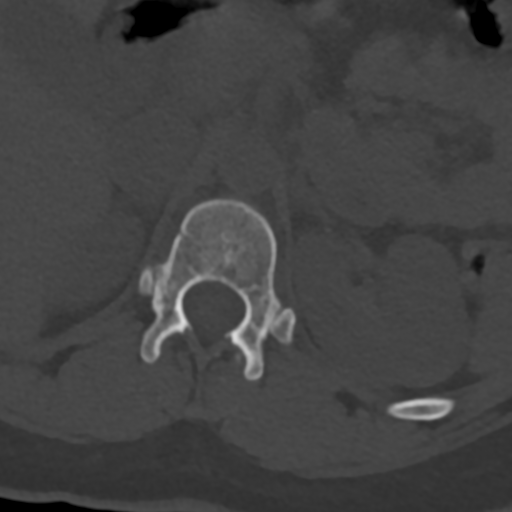

[Series 5: sag bone · sagittal · 0.30mm/px · 5 of 89 slices shown]
[im 15/89  bone]
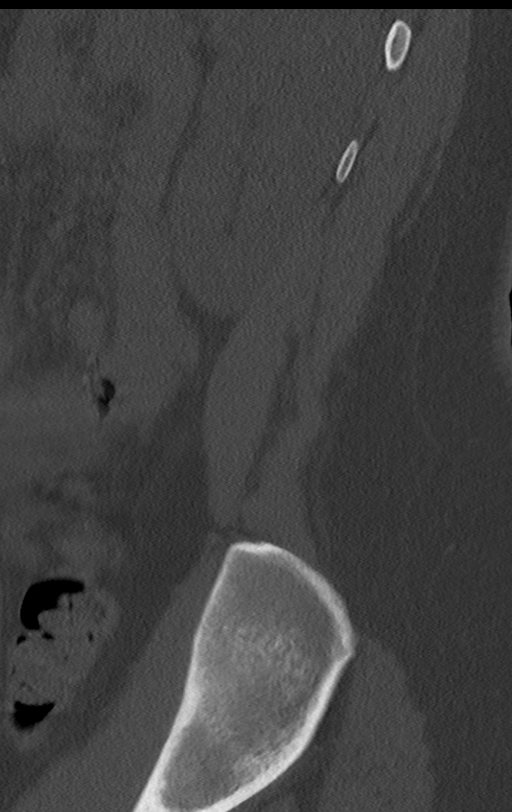
[im 30/89  bone]
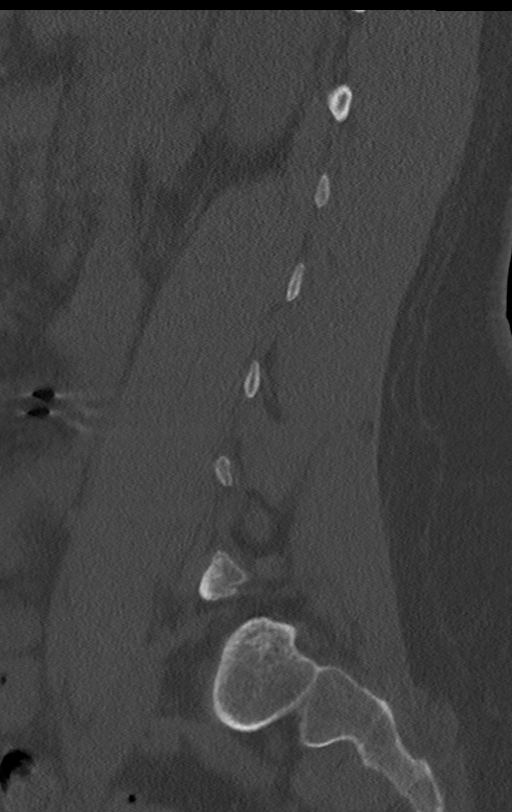
[im 45/89  bone]
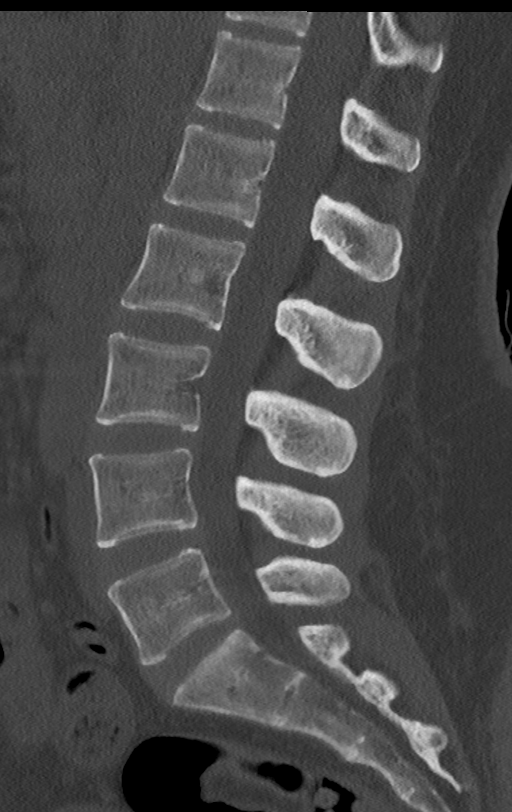
[im 59/89  bone]
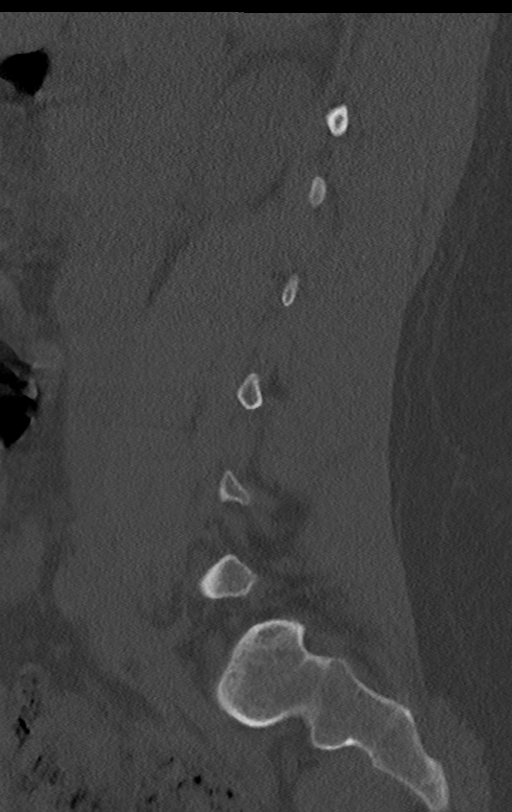
[im 74/89  bone]
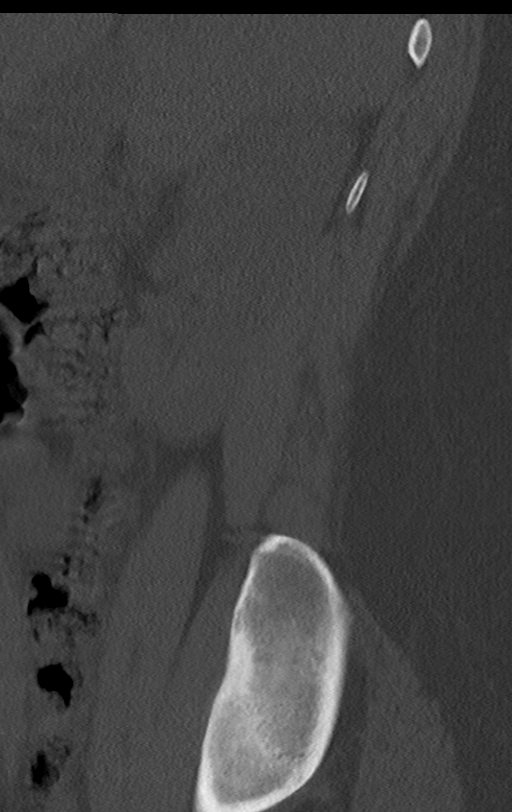

[Series 6: cor bone · coronal · 0.30mm/px · 1 of 73 slices shown]
[im 37/73  bone]
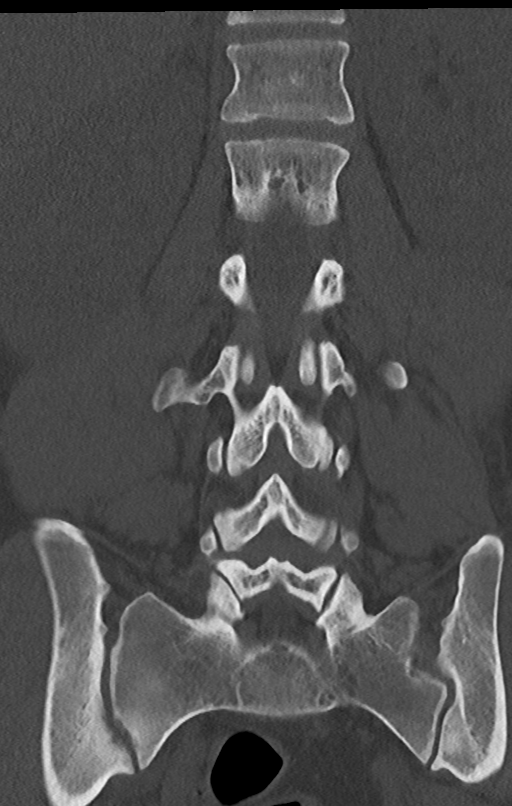

[14 of 33 positions shown; findings below may reference images not displayed]

FINDINGS: Segmentation: 5 non rib-bearing lumbar type vertebral bodies are
present. The lowest fully formed vertebral body is L5.

Alignment: No significant listhesis is present.

Vertebrae: Vertebral body heights are normal. No focal osseous
lesions are present.

Paraspinal and other soft tissues: Limited imaging the abdomen is
unremarkable. There is no significant adenopathy. No solid organ
lesions are present.

Disc levels: Disc levels at L3-4 and above are normal.

L4-5: A leftward disc bulge is present. No significant stenosis is
evident.

L5-S1: Degenerative facet changes are present bilaterally. Spurring
from both facet joints results in moderate left and mild right
foraminal stenosis. The central canal is patent.
IMPRESSION: 1. Degenerative facet changes with spurring at L5-S1 result in
moderate left and mild right foraminal stenosis.
2. Shallow leftward disc bulge at L4-5 without significant stenosis.

## 2024-06-22 ENCOUNTER — Ambulatory Visit: Payer: Self-pay

## 2024-06-22 NOTE — Telephone Encounter (Signed)
 FYI Only or Action Required?: Action required by provider: request for appointment.  Patient was last seen in primary care on 07/21/2022 by [name], PA-C.  Called Nurse Triage reporting Shortness of Breath.  Symptoms began several months ago.  Interventions attempted: Nothing.  Symptoms are: gradually worsening.  Triage Disposition: See PCP Within 2 Weeks  Patient/caregiver understands and will follow disposition?: Yes     Copied from CRM [id number]. Topic: Clinical - Red Word Triage >> Jun 22, 2024 12:44 PM Winona R wrote: Mom name Cassandra Moore on the line with the pt in the room. Shortness of breathe and chest pain. 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 She states the house they live in the mold is really bad. She states they don't even have anything in their closet because that is where is it the worst and the owner isn't doing anything about it. She states she was living in [place]  and had no issues and now that she's been back for a couple of month she is having issues. She states the pain in her chest is intermittent and feels like a strain when she coughs. There is an itch in her throat that won't go away, she does get a stuffy nose. Pt states she also fell a couple of weeks ago and had a bump on her skin and that spot still feels numb.  Intermittent feels like strain when coughing  Pt has very limited scheduling. Requested a Monday or Tuesday in the morning. No availability until December. Advised would send message to the office. Pt stated understanding.    1. RESPIRATORY STATUS: Describe your breathing? (e.g., wheezing, shortness of breath, unable to speak, severe coughing)      Wheezing waking up in the morning, shortness of  breath with exertion.  2. ONSET: When did this breathing problem begin?      2-3 months ago 3. PATTERN Does the difficult breathing come and go, or has it been constant since it started?      constant 4. SEVERITY: How bad is your breathing? (e.g., mild, moderate, severe)      Mild to moderate 5. RECURRENT SYMPTOM: Have you had difficulty breathing before? If Yes, ask: When was the last time? and What happened that time?      no 6. CARDIAC HISTORY: Do you have any history of heart disease? (e.g., heart attack, angina, bypass surgery, angioplasty)      no 7. LUNG HISTORY: Do you have any history of lung disease?  (e.g., pulmonary embolus, asthma, emphysema)     no 8. CAUSE: What do you think is causing the breathing problem?      mold 9. OTHER SYMPTOMS: Do you have any other symptoms? (e.g., chest pain, cough, dizziness, fever, runny nose)     Stuffy nose if around the mold too much.  Protocols used: Breathing Difficulty-A-AH

## 2024-06-22 NOTE — Telephone Encounter (Signed)
 Advised patient to go the UC or ED for S/S.

## 2024-07-30 ENCOUNTER — Other Ambulatory Visit: Payer: Self-pay

## 2024-07-30 ENCOUNTER — Emergency Department (HOSPITAL_COMMUNITY)
Admission: EM | Admit: 2024-07-30 | Discharge: 2024-07-31 | Disposition: A | Attending: Emergency Medicine | Admitting: Emergency Medicine

## 2024-07-30 DIAGNOSIS — M5417 Radiculopathy, lumbosacral region: Secondary | ICD-10-CM

## 2024-07-30 NOTE — ED Triage Notes (Signed)
 First Nurse Note: Pt presents with low back pain x 1 week with worsening after the last 2 days. Denies injury. Denies urinary/fecal incontinence or saddle anesthesia.

## 2024-07-30 NOTE — ED Triage Notes (Signed)
 Patient reports lower back/hip pain that shoots down right leg x 1 week and getting worse. Patient reports she has had PT for same previously but has not kept up with exercises since stopping and the cold has made it worse.

## 2024-07-31 LAB — URINALYSIS, ROUTINE W REFLEX MICROSCOPIC
Bilirubin Urine: NEGATIVE
Glucose, UA: NEGATIVE mg/dL
Hgb urine dipstick: NEGATIVE
Ketones, ur: NEGATIVE mg/dL
Nitrite: NEGATIVE
Protein, ur: NEGATIVE mg/dL
Specific Gravity, Urine: 1.024 (ref 1.005–1.030)
pH: 6 (ref 5.0–8.0)

## 2024-07-31 LAB — PREGNANCY, URINE: Preg Test, Ur: NEGATIVE

## 2024-07-31 MED ORDER — DEXAMETHASONE 4 MG PO TABS
12.0000 mg | ORAL_TABLET | Freq: Once | ORAL | Status: AC
  Administered 2024-07-31: 12 mg via ORAL
  Filled 2024-07-31: qty 3

## 2024-07-31 MED ORDER — METHOCARBAMOL 500 MG PO TABS: 500.0000 mg | ORAL_TABLET | Freq: Two times a day (BID) | ORAL | 0 refills | Status: AC

## 2024-07-31 MED ORDER — METHOCARBAMOL 500 MG PO TABS
500.0000 mg | ORAL_TABLET | Freq: Once | ORAL | Status: AC
  Administered 2024-07-31: 500 mg via ORAL
  Filled 2024-07-31: qty 1

## 2024-07-31 NOTE — ED Provider Notes (Signed)
 [facility] AT [facility] Provider Note   CSN: [id number] Arrival date & time: 07/30/24  2232     Patient presents with: Back Pain   Cassandra Moore is a 23 y.o. female.   The history is provided by the patient.  Patient presents with right lower back pain that raise to her right leg for over a week.  No falls or trauma.  She reports her right leg feels weak but she is ambulatory.  No incontinence is reported.  No previous back surgery.   She denies any abdominal pain or dysuria.  No fevers or vomiting She reports similar episodes in the past and was diagnosed with radiculopathy and has had PT with some improvement.  However when it gets cold outside her pain seems to worsen. She reports occasional numbness in the right leg   Past Medical History:  Diagnosis Date   Migraine    Migraines    Vertigo    Vertigo     Prior to Admission medications   Medication Sig Start Date End Date Taking? Authorizing Provider  methocarbamol  (ROBAXIN ) 500 MG tablet Take 1 tablet (500 mg total) by mouth 2 (two) times daily. 07/31/24  Yes Midge Golas, MD  Norgestimate-Ethinyl Estradiol Triphasic (ORTHO TRI-CYCLEN LO) 0.18/0.215/0.25 MG-25 MCG tab Take 1 tablet by mouth daily. START ON SUNDAY 03/10/22   Fleming, Zelda W, NP    Allergies: Patient has no known allergies.    Review of Systems  Constitutional:  Negative for fever.  Musculoskeletal:  Positive for back pain.    Updated Vital Signs BP 107/62 (BP Location: Right Arm)   Pulse 91   Temp 98.7 F (37.1 C)   Resp 20   Ht 1.549 m (5' 1)   Wt 95.3 kg   SpO2 100%   BMI 39.68 kg/m   Physical Exam CONSTITUTIONAL: Well developed/well nourished HEAD: Normocephalic/atraumatic ENMT: Mucous membranes moist NECK: supple no meningeal signs SPINE/BACK:entire spine nontender tenderness along the right paraspinal and sacral region, no bruising or erythema ABDOMEN: soft, nontender GU:no cva tenderness NEURO:  Awake/alert, equal motor 5/5 strength noted with the following: hip flexion/knee flexion/extension, foot dorsi/plantar flexion, great toe extension intact bilaterally, no clonus bilaterally, plantar reflex appropriate (toes downgoing), no sensory deficit in any dermatome. Pt is able to ambulate unassisted. EXTREMITIES: pulses normal, full ROM SKIN: warm, color normal PSYCH: no abnormalities of mood noted, alert and oriented to situation  (all labs ordered are listed, but only abnormal results are displayed) Labs Reviewed  URINALYSIS, ROUTINE W REFLEX MICROSCOPIC - Abnormal; Notable for the following components:      Result Value   APPearance HAZY (*)    Leukocytes,Ua TRACE (*)    Bacteria, UA RARE (*)    All other components within normal limits  PREGNANCY, URINE    EKG: None  Radiology: No results found.   Procedures   Medications Ordered in the ED  dexamethasone  (DECADRON ) tablet 12 mg (has no administration in time range)  methocarbamol  (ROBAXIN ) tablet 500 mg (has no administration in time range)                                    Medical Decision Making Amount and/or Complexity of Data Reviewed Labs: ordered.  Risk Prescription drug management.   This patient presents to the ED for concern of back pain, this involves an extensive number of treatment options, and is a  complaint that carries with it a high risk of complications and morbidity.  The differential diagnosis includes but is not limited to lumbosacral radiculopathy, epidural abscess, discitis, pyelonephritis, ureteral colic, ectopic pregnancy, PID, tubo-ovarian abscess, ovarian torsion  Comorbidities that complicate the patient evaluation: Patient's presentation is complicated by their history of previous back pain, migraines  Social Determinants of Health: Patient's smoking history  increases the complexity of managing their presentation  Additional history obtained: Records reviewed outpatient records  reviewed  Lab Tests: I Ordered, and personally interpreted labs.  The pertinent results include: No evidence of UTI  Medicines ordered and prescription drug management: I ordered medication including Robaxin  for pain  Test Considered: MRI lumbar spine was considered but patient without any neurodeficits and has no red flags (no weakness, no previous surgery, no IV substance use reported)  Complexity of problems addressed: Patient's presentation is most consistent with  exacerbation of chronic illness  Disposition: After consideration of the diagnostic results and the patient's response to treatment,  I feel that the patent would benefit from discharge  .    Patient well-appearing, reports onset of right low back pain that raise to right leg, but no focal neurodeficits she is ambulatory. She has had radiculopathy before.  She had previous CT imaging and PT She would like to follow back up with ortho and may undergo PT again if she can find the time.  Reports good response with steroids and Robaxin  in the past We discussed strict return precautions    Final diagnoses:  Lumbosacral radiculopathy    ED Discharge Orders          Ordered    methocarbamol  (ROBAXIN ) 500 MG tablet  2 times daily        07/31/24 0035               Midge Golas, MD 07/31/24 [phone]

## 2024-07-31 NOTE — Discharge Instructions (Signed)
 SEEK IMMEDIATE MEDICAL ATTENTION IF: New numbness, tingling, weakness, or problem with the use of your arms or legs.  Severe back pain not relieved with medications.  Change in bowel or bladder control (if you lose control of stool or urine, or if you are unable to urinate) Increasing pain in any areas of the body (such as chest or abdominal pain).  Shortness of breath, dizziness or fainting.  Nausea (feeling sick to your stomach), vomiting, fever, or sweats.

## 2024-10-08 ENCOUNTER — Ambulatory Visit: Payer: Self-pay | Admitting: Nurse Practitioner
